# Patient Record
Sex: Female | Born: 1990 | Race: Black or African American | Hispanic: No | Marital: Married | Smoking: Never smoker
Health system: Southern US, Community
[De-identification: ages and names within clinical notes are randomized; demographics above are authoritative.]

## PROBLEM LIST (undated history)

## (undated) DIAGNOSIS — D649 Anemia, unspecified: Secondary | ICD-10-CM

## (undated) DIAGNOSIS — A159 Respiratory tuberculosis unspecified: Secondary | ICD-10-CM

## (undated) HISTORY — PX: NO PAST SURGERIES: SHX2092

## (undated) HISTORY — DX: Respiratory tuberculosis unspecified: A15.9

## (undated) HISTORY — DX: Anemia, unspecified: D64.9

---

## 2016-01-14 ENCOUNTER — Other Ambulatory Visit: Payer: Self-pay | Admitting: *Deleted

## 2016-01-14 ENCOUNTER — Ambulatory Visit
Admission: RE | Admit: 2016-01-14 | Discharge: 2016-01-14 | Disposition: A | Discharge status: Home / Self Care | Payer: 59 | Source: Ambulatory Visit | Attending: Maternal & Fetal Medicine | Admitting: Maternal & Fetal Medicine

## 2016-01-14 DIAGNOSIS — Z3493 Encounter for supervision of normal pregnancy, unspecified, third trimester: Secondary | ICD-10-CM | POA: Insufficient documentation

## 2016-01-14 DIAGNOSIS — Z3A31 31 weeks gestation of pregnancy: Secondary | ICD-10-CM | POA: Insufficient documentation

## 2016-01-14 DIAGNOSIS — O4593 Premature separation of placenta, unspecified, third trimester: Secondary | ICD-10-CM

## 2016-02-18 ENCOUNTER — Inpatient Hospital Stay
Admission: EM | Admit: 2016-02-18 | Discharge: 2016-02-22 | DRG: 765 | Disposition: A | Discharge status: Home / Self Care | Payer: 59 | Attending: Obstetrics and Gynecology | Admitting: Obstetrics and Gynecology

## 2016-02-18 DIAGNOSIS — O324XX0 Maternal care for high head at term, not applicable or unspecified: Principal | ICD-10-CM | POA: Diagnosis present

## 2016-02-18 DIAGNOSIS — O9081 Anemia of the puerperium: Secondary | ICD-10-CM | POA: Diagnosis not present

## 2016-02-18 DIAGNOSIS — O479 False labor, unspecified: Secondary | ICD-10-CM | POA: Diagnosis present

## 2016-02-18 DIAGNOSIS — D62 Acute posthemorrhagic anemia: Secondary | ICD-10-CM | POA: Diagnosis not present

## 2016-02-18 DIAGNOSIS — Z3A36 36 weeks gestation of pregnancy: Secondary | ICD-10-CM

## 2016-02-18 MED ORDER — LACTATED RINGERS IV BOLUS (SEPSIS)
1000.0000 mL | Freq: Once | INTRAVENOUS | Status: AC
  Administered 2016-02-19: 1000 mL via INTRAVENOUS

## 2016-02-18 MED ORDER — CALCIUM CARBONATE ANTACID 500 MG PO CHEW: 2.0000 | CHEWABLE_TABLET | ORAL | Status: DC | PRN

## 2016-02-18 MED ORDER — PRENATAL MULTIVITAMIN CH: 1.0000 | ORAL_TABLET | Freq: Every day | ORAL | Status: DC

## 2016-02-18 MED ORDER — LACTATED RINGERS IV SOLN
INTRAVENOUS | Status: DC
  Administered 2016-02-19 (×2): via INTRAVENOUS

## 2016-02-18 MED ORDER — DOCUSATE SODIUM 100 MG PO CAPS: 100.0000 mg | ORAL_CAPSULE | Freq: Every day | ORAL | Status: DC

## 2016-02-18 MED ORDER — TERBUTALINE SULFATE 1 MG/ML IJ SOLN
0.2500 mg | Freq: Once | INTRAMUSCULAR | Status: AC
  Administered 2016-02-18 – 2016-02-19 (×3): 0.25 mg via SUBCUTANEOUS
  Filled 2016-02-18: qty 1

## 2016-02-18 MED ORDER — ACETAMINOPHEN 325 MG PO TABS
650.0000 mg | ORAL_TABLET | ORAL | Status: DC | PRN
  Administered 2016-02-19: 650 mg via ORAL
  Filled 2016-02-18: qty 2

## 2016-02-18 NOTE — H&P (Signed)
Obstetric H&P   Chief Complaint: Contractions  Prenatal Care Provider: [facility]  History of Present Illness: 26 y.o. G1P0 755w1d by LMP=9w US derived EDD of 03/16/2016, presenting to L&D with contractions.  No LOF, no VB, +FM.  Contractions started this evening, tried po hydrating but continued to have contractions.    [facility]  23lbs weight gain this pregnancy.  Growth scan on 02/15/16 at 1878w5d 2379g or 5lbs 4oz c/w 20.5%ile, AFI 12.01cm.  Received TDAP 01/13/2016.  31 week follow up US for S<D with concern for retroplacental clot but normal DP MFM ultrasound, no risk factors or symptoms consistent with abruption.  O pos / ABSC neg / RI / VZI / RPR NR / HIV neg / HBsAg neg / 1-hr 70 / GBS unknown  Review of Systems: 10 point review of systems negative unless otherwise noted in HPI  Past Medical History: History reviewed. No pertinent past medical history.  Past Surgical History: History reviewed. No pertinent surgical history.  Family History: No family history on file.  Social History: Social History   Social History  . Marital status: Married    Spouse name: N/A  . Number of children: N/A  . Years of education: N/A   Occupational History  . Not on file.   Social History Main Topics  . Smoking status: Never Smoker  . Smokeless tobacco: Never Used  . Alcohol use No  . Drug use: No  . Sexual activity: Not Currently   Other Topics Concern  . Not on file   Social History Narrative  . No narrative on file    Medications: Prior to Admission medications   Medication Sig Start Date End Date Taking? Authorizing Provider  acetaminophen (TYLENOL) 500 MG tablet Take 500 mg by mouth every 4 (four) hours as needed.   Yes Historical Provider, MD    Allergies: No Known Allergies  Physical Exam: Vitals: Blood pressure 101/66, pulse (!) 104, temperature 97.4 F (36.3 C), temperature source Oral, resp. rate 18.  Urine Dip Protein: N/A  FHT: 130, moderate  variability, +accels, no decels Toco: q1-532min  General: NAD HEENT: normocephalic, anicteric Pulmonary: no increased work of breathing Cardiovascular: RRR Abdomen: Gravid, non-tender Leopolds: vtx Genitourinary:  Dilation: 1.5 Effacement (%): 20 Cervical Position: Posterior Exam by:: Hurshel PartyE. Rivera, RN  Extremities: no edema Neuro: grossly intact Psych: mood appropriate, affect full  Labs: No results found for this or any previous visit (from the past 24 hour(s)).  Assessment: 26 y.o. G1P0 675w1d by 03/16/2016, presenting with preterm contractions  Plan: 1)  Preterm contractions - 1 dose of terbutaline - recheck in 2-hrs  2) Fetus - cat I tacing  3) PNL - O pos / ABSC neg / RI / VZI / RPR NR / HIV neg / HBsAg neg / 1-hr 70 / GBS unknown  4) TDAP - UTD   5) Disposition - pending recheck

## 2016-02-18 NOTE — OB Triage Note (Signed)
Patient came in c/o contractions that 1200 this afternoon. Reports that since 1900 contractions are closer together and more painful. Rates pain a 6 out of 10. Denies vaginal bleeding. Reports scant amount of brown discharge.

## 2016-02-19 ENCOUNTER — Inpatient Hospital Stay: Payer: 59 | Admitting: Anesthesiology

## 2016-02-19 ENCOUNTER — Encounter: Payer: Self-pay | Admitting: Anesthesiology

## 2016-02-19 ENCOUNTER — Encounter
Admission: EM | Disposition: A | Discharge status: Home / Self Care | Payer: Self-pay | Source: Home / Self Care | Attending: Obstetrics and Gynecology

## 2016-02-19 DIAGNOSIS — O9081 Anemia of the puerperium: Secondary | ICD-10-CM | POA: Diagnosis not present

## 2016-02-19 DIAGNOSIS — O324XX0 Maternal care for high head at term, not applicable or unspecified: Secondary | ICD-10-CM | POA: Diagnosis present

## 2016-02-19 DIAGNOSIS — D62 Acute posthemorrhagic anemia: Secondary | ICD-10-CM | POA: Diagnosis not present

## 2016-02-19 DIAGNOSIS — Z3A36 36 weeks gestation of pregnancy: Secondary | ICD-10-CM | POA: Diagnosis not present

## 2016-02-19 LAB — URINALYSIS, ROUTINE W REFLEX MICROSCOPIC
Bilirubin Urine: NEGATIVE
Glucose, UA: NEGATIVE mg/dL
Ketones, ur: NEGATIVE mg/dL
Nitrite: NEGATIVE
PROTEIN: NEGATIVE mg/dL
Specific Gravity, Urine: 1.005 (ref 1.005–1.030)
pH: 8 (ref 5.0–8.0)

## 2016-02-19 LAB — TYPE AND SCREEN
ABO/RH(D): O POS
ANTIBODY SCREEN: NEGATIVE

## 2016-02-19 LAB — CBC
HEMATOCRIT: 27.9 % — AB (ref 35.0–47.0)
HEMOGLOBIN: 9 g/dL — AB (ref 12.0–16.0)
MCH: 24.8 pg — AB (ref 26.0–34.0)
MCHC: 32.3 g/dL (ref 32.0–36.0)
MCV: 76.7 fL — AB (ref 80.0–100.0)
PLATELETS: 174 10*3/uL (ref 150–440)
RBC: 3.64 MIL/uL — AB (ref 3.80–5.20)
RDW: 15.4 % — ABNORMAL HIGH (ref 11.5–14.5)
WBC: 17.5 10*3/uL — ABNORMAL HIGH (ref 3.6–11.0)

## 2016-02-19 SURGERY — Surgical Case
Anesthesia: Epidural | Site: Abdomen | Wound class: Clean Contaminated

## 2016-02-19 MED ORDER — LIDOCAINE-EPINEPHRINE (PF) 1.5 %-1:200000 IJ SOLN
INTRAMUSCULAR | Status: DC | PRN
  Administered 2016-02-19: 3 mL via PERINEURAL

## 2016-02-19 MED ORDER — PENICILLIN G POTASSIUM 5000000 UNITS IJ SOLR
5.0000 10*6.[IU] | Freq: Once | INTRAMUSCULAR | Status: AC
  Administered 2016-02-19: 5 10*6.[IU] via INTRAVENOUS
  Filled 2016-02-19: qty 5

## 2016-02-19 MED ORDER — LACTATED RINGERS IV SOLN: 500.0000 mL | INTRAVENOUS | Status: DC | PRN

## 2016-02-19 MED ORDER — TERBUTALINE SULFATE 1 MG/ML IJ SOLN
INTRAMUSCULAR | Status: AC
  Administered 2016-02-19: 0.25 mg via SUBCUTANEOUS
  Filled 2016-02-19: qty 1

## 2016-02-19 MED ORDER — OXYTOCIN 10 UNIT/ML IJ SOLN: 10.0000 [IU] | Freq: Once | INTRAMUSCULAR | Status: DC

## 2016-02-19 MED ORDER — BUTORPHANOL TARTRATE 1 MG/ML IJ SOLN
1.0000 mg | Freq: Once | INTRAMUSCULAR | Status: AC
  Administered 2016-02-19: 1 mg via INTRAVENOUS

## 2016-02-19 MED ORDER — OXYTOCIN BOLUS FROM INFUSION: 500.0000 mL | Freq: Once | INTRAVENOUS | Status: DC

## 2016-02-19 MED ORDER — BUTORPHANOL TARTRATE 1 MG/ML IJ SOLN
INTRAMUSCULAR | Status: AC
Start: 2016-02-19 — End: 2016-02-19
  Administered 2016-02-19: 1 mg via INTRAVENOUS
  Filled 2016-02-19: qty 1

## 2016-02-19 MED ORDER — BUPIVACAINE HCL (PF) 0.5 % IJ SOLN
INTRAMUSCULAR | Status: AC
  Filled 2016-02-19: qty 30

## 2016-02-19 MED ORDER — FENTANYL 2.5 MCG/ML W/ROPIVACAINE 0.2% IN NS 100 ML EPIDURAL INFUSION (ARMC-ANES)
10.0000 mL/h | EPIDURAL | Status: DC
Start: 2016-02-19 — End: 2016-02-20
  Administered 2016-02-19: 10 mL/h via EPIDURAL

## 2016-02-19 MED ORDER — PHENYLEPHRINE 40 MCG/ML (10ML) SYRINGE FOR IV PUSH (FOR BLOOD PRESSURE SUPPORT)
PREFILLED_SYRINGE | INTRAVENOUS | Status: AC
  Filled 2016-02-19: qty 10

## 2016-02-19 MED ORDER — MISOPROSTOL 200 MCG PO TABS
ORAL_TABLET | ORAL | Status: AC
  Filled 2016-02-19: qty 4

## 2016-02-19 MED ORDER — LACTATED RINGERS IV SOLN
INTRAVENOUS | Status: DC
  Administered 2016-02-19: 17:00:00 via INTRAVENOUS

## 2016-02-19 MED ORDER — SOD CITRATE-CITRIC ACID 500-334 MG/5ML PO SOLN
30.0000 mL | ORAL | Status: AC
  Administered 2016-02-19: 30 mL via ORAL

## 2016-02-19 MED ORDER — BUPIVACAINE HCL (PF) 0.5 % IJ SOLN
5.0000 mL | Freq: Once | INTRAMUSCULAR | Status: DC
  Filled 2016-02-19: qty 30

## 2016-02-19 MED ORDER — EPHEDRINE 5 MG/ML INJ: 10.0000 mg | INTRAVENOUS | Status: DC | PRN

## 2016-02-19 MED ORDER — PHENYLEPHRINE 40 MCG/ML (10ML) SYRINGE FOR IV PUSH (FOR BLOOD PRESSURE SUPPORT): 80.0000 ug | PREFILLED_SYRINGE | INTRAVENOUS | Status: DC | PRN

## 2016-02-19 MED ORDER — FENTANYL 2.5 MCG/ML W/ROPIVACAINE 0.2% IN NS 100 ML EPIDURAL INFUSION (ARMC-ANES)
EPIDURAL | Status: AC
  Filled 2016-02-19: qty 100

## 2016-02-19 MED ORDER — OXYTOCIN 10 UNIT/ML IJ SOLN
INTRAMUSCULAR | Status: AC
  Filled 2016-02-19: qty 2

## 2016-02-19 MED ORDER — OXYTOCIN 40 UNITS IN LACTATED RINGERS INFUSION - SIMPLE MED: 2.5000 [IU]/h | INTRAVENOUS | Status: DC

## 2016-02-19 MED ORDER — HYDROMORPHONE HCL 1 MG/ML IJ SOLN: 0.5000 mg | INTRAMUSCULAR | Status: DC | PRN

## 2016-02-19 MED ORDER — LACTATED RINGERS IV SOLN
500.0000 mL | Freq: Once | INTRAVENOUS | Status: AC
  Administered 2016-02-19: 500 mL via INTRAVENOUS

## 2016-02-19 MED ORDER — BUPIVACAINE 0.25 % ON-Q PUMP DUAL CATH 400 ML
400.0000 mL | INJECTION | Status: DC
Start: 2016-02-19 — End: 2016-02-20
  Filled 2016-02-19: qty 400

## 2016-02-19 MED ORDER — SOD CITRATE-CITRIC ACID 500-334 MG/5ML PO SOLN
30.0000 mL | ORAL | Status: DC | PRN
  Filled 2016-02-19: qty 15

## 2016-02-19 MED ORDER — CEFAZOLIN SODIUM-DEXTROSE 2-4 GM/100ML-% IV SOLN
2.0000 g | INTRAVENOUS | Status: AC
  Administered 2016-02-20: 2 g via INTRAVENOUS
  Filled 2016-02-19: qty 100

## 2016-02-19 MED ORDER — PENICILLIN G POTASSIUM 5000000 UNITS IJ SOLR
2.5000 10*6.[IU] | INTRAVENOUS | Status: DC
  Administered 2016-02-19 (×2): 2.5 10*6.[IU] via INTRAVENOUS
  Filled 2016-02-19 (×9): qty 2.5

## 2016-02-19 MED ORDER — ONDANSETRON HCL 4 MG/2ML IJ SOLN: 4.0000 mg | Freq: Four times a day (QID) | INTRAMUSCULAR | Status: DC | PRN

## 2016-02-19 MED ORDER — LIDOCAINE HCL (PF) 1 % IJ SOLN: 30.0000 mL | INTRAMUSCULAR | Status: DC | PRN

## 2016-02-19 MED ORDER — SODIUM CHLORIDE FLUSH 0.9 % IV SOLN
INTRAVENOUS | Status: AC
  Filled 2016-02-19: qty 10

## 2016-02-19 MED ORDER — LIDOCAINE HCL (PF) 1 % IJ SOLN
INTRAMUSCULAR | Status: DC | PRN
  Administered 2016-02-19: 3 mL

## 2016-02-19 MED ORDER — MORPHINE SULFATE (PF) 0.5 MG/ML IJ SOLN
INTRAMUSCULAR | Status: AC
  Filled 2016-02-19: qty 10

## 2016-02-19 MED ORDER — BUPIVACAINE HCL (PF) 0.5 % IJ SOLN: 5.0000 mL | Freq: Once | INTRAMUSCULAR | Status: DC

## 2016-02-19 MED ORDER — DEXTROSE 5 % IV SOLN
500.0000 mg | INTRAVENOUS | Status: AC
  Administered 2016-02-20: 500 mg via INTRAVENOUS
  Filled 2016-02-19: qty 500

## 2016-02-19 MED ORDER — DIPHENHYDRAMINE HCL 50 MG/ML IJ SOLN: 12.5000 mg | INTRAMUSCULAR | Status: DC | PRN

## 2016-02-19 MED ORDER — AMMONIA AROMATIC IN INHA
RESPIRATORY_TRACT | Status: AC
  Filled 2016-02-19: qty 10

## 2016-02-19 MED ORDER — FENTANYL CITRATE (PF) 100 MCG/2ML IJ SOLN
INTRAMUSCULAR | Status: AC
  Filled 2016-02-19: qty 2

## 2016-02-19 MED ORDER — BUPIVACAINE HCL (PF) 0.25 % IJ SOLN
INTRAMUSCULAR | Status: DC | PRN
  Administered 2016-02-19: 4 mL via EPIDURAL
  Administered 2016-02-19: 5 mL via EPIDURAL

## 2016-02-19 MED ORDER — LIDOCAINE HCL (PF) 1 % IJ SOLN
INTRAMUSCULAR | Status: AC
  Filled 2016-02-19: qty 30

## 2016-02-19 SURGICAL SUPPLY — 31 items
CANISTER SUCT 3000ML (MISCELLANEOUS) ×3 IMPLANT
CATH KIT ON-Q SILVERSOAK 5IN (CATHETERS) ×6 IMPLANT
CLOSURE WOUND 1/2 X4 (GAUZE/BANDAGES/DRESSINGS)
DRSG OPSITE POSTOP 4X10 (GAUZE/BANDAGES/DRESSINGS) IMPLANT
DRSG TELFA 3X8 NADH (GAUZE/BANDAGES/DRESSINGS) IMPLANT
ELECT CAUTERY BLADE 6.4 (BLADE) ×3 IMPLANT
ELECT REM PT RETURN 9FT ADLT (ELECTROSURGICAL) ×3
ELECTRODE REM PT RTRN 9FT ADLT (ELECTROSURGICAL) ×1 IMPLANT
GAUZE SPONGE 4X4 12PLY STRL (GAUZE/BANDAGES/DRESSINGS) IMPLANT
GLOVE BIO SURGEON STRL SZ7 (GLOVE) ×15 IMPLANT
GLOVE INDICATOR 7.5 STRL GRN (GLOVE) ×12 IMPLANT
GOWN STRL REUS W/ TWL LRG LVL3 (GOWN DISPOSABLE) ×4 IMPLANT
GOWN STRL REUS W/TWL LRG LVL3 (GOWN DISPOSABLE) ×8
LIQUID BAND (GAUZE/BANDAGES/DRESSINGS) ×3 IMPLANT
NS IRRIG 1000ML POUR BTL (IV SOLUTION) ×3 IMPLANT
PACK C SECTION AR (MISCELLANEOUS) ×3 IMPLANT
PAD OB MATERNITY 4.3X12.25 (PERSONAL CARE ITEMS) ×3 IMPLANT
PAD PREP 24X41 OB/GYN DISP (PERSONAL CARE ITEMS) ×3 IMPLANT
SPONGE LAP 18X18 5 PK (GAUZE/BANDAGES/DRESSINGS) ×3 IMPLANT
STRIP CLOSURE SKIN 1/2X4 (GAUZE/BANDAGES/DRESSINGS) IMPLANT
SUT CHROMIC GUT BROWN 0 54 (SUTURE) IMPLANT
SUT CHROMIC GUT BROWN 0 54IN (SUTURE)
SUT MNCRL 4-0 (SUTURE) ×4
SUT MNCRL 4-0 27XMFL (SUTURE) ×2
SUT PDS AB 1 TP1 96 (SUTURE) ×3 IMPLANT
SUT PLAIN 2 0 XLH (SUTURE) ×3 IMPLANT
SUT VIC AB 0 CT1 36 (SUTURE) ×9 IMPLANT
SUT VIC AB 3-0 SH 27 (SUTURE) ×2
SUT VIC AB 3-0 SH 27X BRD (SUTURE) ×1 IMPLANT
SUTURE MNCRL 4-0 27XMF (SUTURE) ×2 IMPLANT
SWABSTK COMLB BENZOIN TINCTURE (MISCELLANEOUS) IMPLANT

## 2016-02-19 NOTE — Progress Notes (Signed)
Labor Check  Subj:  Complaints: continued contractions   Obj:  BP 108/65   Pulse 82   Temp 97.7 F (36.5 C)   Resp (!) 24   Ht 5\' 8"  (1.727 m)   Wt 143 lb (64.9 kg)   BMI 21.74 kg/m     Cervix: Dilation: 4 / Effacement (%): 80 / Station: -1  (previously 2.5cm) Baseline FHR: 135    Variability: moderate    Accelerations: present    Decelerations: present (periodic variable decelerations (shallow with quick return to baseline)) Contractions: present frequency: 3 q 10 minutes  A/P: 26 y.o. G1P0 female at 4853w2d with active preterm labor.  The patient has received two doses of terbutaline and continues to contract.  Discussed preterm labor. Will not augment unless fetal testing does not resolve.  Admit now with active labor.   1.  Labor: expectant management  2.  FWB: reassuring, though somewhat guarded with variable decels, Overall assessment: category 2  3.  GBS unknown - pcn  4.  Pain: prn 5.  Recheck: q 2 hours, prn   Thomasene MohairStephen Sederick Jacobsen, MD 02/19/2016 12:55 PM

## 2016-02-19 NOTE — Progress Notes (Signed)
Patient has pushed for 2.5 hours and has made minimal descent in the latter part of that time. I had previously discussed the need for cesarean delivery, if no further descent.  On recheck after 30 minutes, there is no further descent with increasing caput.  Patient asks for 30 more minutes (3 hours total) to push.  Discussed that if fetal tracing becomes more concerning in that time, then she will need to go for cesarean delivery immediately.  If no change in that time, we will move to cesarean delivery. She voiced understanding and agreement.  Tammie MohairStephen Christalynn Boise, MD 02/19/2016 11:20 PM

## 2016-02-19 NOTE — Progress Notes (Signed)
Fetus with deep variables with each contraction. Moving to cesarean delivery.  Giving terbutaline stat to relieve contractions. If no immediate relief, will move section to STAT.  All questions answered. Patient consented by me. Reviewed all risks, benefits of surgery.    Tammie MohairStephen Annlouise Gerety, MD 02/19/2016 11:38 PM

## 2016-02-19 NOTE — Anesthesia Procedure Notes (Signed)
Epidural Patient location during procedure: OB Start time: 02/19/2016 3:18 PM End time: 02/19/2016 3:29 PM  Staffing Anesthesiologist: Berdine AddisonHOMAS, MATHAI Resident/CRNA: Ginger CarneMICHELET, Charrise Lardner Performed: resident/CRNA   Preanesthetic Checklist Completed: patient identified, site marked, surgical consent, pre-op evaluation, timeout performed, IV checked, risks and benefits discussed and monitors and equipment checked  Epidural Patient position: sitting Prep: Betadine Patient monitoring: heart rate, continuous pulse ox and blood pressure Approach: midline Location: L4-L5 Injection technique: LOR air  Needle:  Needle type: Tuohy  Needle gauge: 17 G Needle length: 9 cm and 9 Needle insertion depth: 7 cm Catheter type: closed end flexible Catheter size: 19 Gauge Catheter at skin depth: 15 cm Test dose: negative and 1.5% lidocaine with Epi 1:200 K  Assessment Sensory level: T10 Events: blood not aspirated, injection not painful, no injection resistance, negative IV test and no paresthesia  Additional Notes Pt. Evaluated and documentation done after procedure finished. Patient identified. Risks/Benefits/Options discussed with patient including but not limited to bleeding, infection, nerve damage, paralysis, failed block, incomplete pain control, headache, blood pressure changes, nausea, vomiting, reactions to medication both or allergic, itching and postpartum back pain. Confirmed with bedside nurse the patient's most recent platelet count. Confirmed with patient that they are not currently taking any anticoagulation, have any bleeding history or any family history of bleeding disorders. Patient expressed understanding and wished to proceed. All questions were answered. Sterile technique was used throughout the entire procedure. Please see nursing notes for vital signs. Test dose was given through epidural catheter and negative prior to continuing to dose epidural or start infusion. Warning signs  of high block given to the patient including shortness of breath, tingling/numbness in hands, complete motor block, or any concerning symptoms with instructions to call for help. Patient was given instructions on fall risk and not to get out of bed. All questions and concerns addressed with instructions to call with any issues or inadequate analgesia.   Patient tolerated the insertion well without immediate complications.Reason for block:procedure for pain

## 2016-02-19 NOTE — Anesthesia Preprocedure Evaluation (Signed)
Anesthesia Evaluation  Patient identified by MRN, date of birth, ID band Patient awake    Reviewed: Allergy & Precautions, NPO status , Patient's Chart, lab work & pertinent test results, reviewed documented beta blocker date and time   Airway Mallampati: II  TM Distance: >3 FB     Dental  (+) Chipped   Pulmonary           Cardiovascular      Neuro/Psych    GI/Hepatic   Endo/Other    Renal/GU      Musculoskeletal   Abdominal   Peds  Hematology   Anesthesia Other Findings   Reproductive/Obstetrics                             Anesthesia Physical Anesthesia Plan  ASA: II  Anesthesia Plan: Epidural   Post-op Pain Management:    Induction:   Airway Management Planned:   Additional Equipment:   Intra-op Plan:   Post-operative Plan:   Informed Consent: I have reviewed the patients History and Physical, chart, labs and discussed the procedure including the risks, benefits and alternatives for the proposed anesthesia with the patient or authorized representative who has indicated his/her understanding and acceptance.     Plan Discussed with: CRNA  Anesthesia Plan Comments:         Anesthesia Quick Evaluation  

## 2016-02-19 NOTE — Progress Notes (Signed)
Subjective:  Starting to feel contractions again, rates them 5/10 currently  Objective:   Vitals: Blood pressure (!) 102/54, pulse (!) 101, temperature 97.5 F (36.4 C), temperature source Oral, resp. rate 16. General: NAD Abdomen: gravid, non-tender Cervical Exam:  1/50/-3 FHT:  Toco: q5-547min, from 1-912min on admission  No results found for this or any previous visit (from the past 24 hour(s)).  Assessment:   26 y.o. G1P0 3577w2d braxton-hicks contractions  Plan:   1) Labor - braxton-hick contractions based on no cervical change.  Will continue to observe until AM to verify continue to space out and no cervical change overnight  2) Fetus - cat I tracing.

## 2016-02-19 NOTE — Progress Notes (Addendum)
Subjective:  Called to review strip  Objective:   Vitals: Blood pressure 94/62, pulse (!) 103, temperature 97.4 F (36.3 C), temperature source Oral, resp. rate 16. General:  Abdomen: Cervical Exam:  Dilation: 2.5 Effacement (%): 60 Cervical Position: Posterior Station: -3 Exam by:: Sheila OatsB. Spielman, RN   FHT: 130, moderate, no accels, intermitten variables Toco: no picking up much in way of contractions currently  No results found for this or any previous visit (from the past 24 hour(s)).  Assessment:   26 y.o. G1P0 3364w2d with threatened preterm labor  Plan:   1) Labor - contractions have spaced out but still reports 6/10 pain even after receiving stadol - will check CBC - check UA - start PCN for GBS unknown  2) Fetus - cat Ii given intermittent variables.  Not ruptured and normal AFI 4 days ago

## 2016-02-20 DIAGNOSIS — Z3A36 36 weeks gestation of pregnancy: Secondary | ICD-10-CM

## 2016-02-20 LAB — RPR: RPR: NONREACTIVE

## 2016-02-20 LAB — CBC
HCT: 24.4 % — ABNORMAL LOW (ref 35.0–47.0)
Hemoglobin: 7.8 g/dL — ABNORMAL LOW (ref 12.0–16.0)
MCH: 24.5 pg — ABNORMAL LOW (ref 26.0–34.0)
MCHC: 32 g/dL (ref 32.0–36.0)
MCV: 76.7 fL — AB (ref 80.0–100.0)
Platelets: 178 10*3/uL (ref 150–440)
RBC: 3.18 MIL/uL — AB (ref 3.80–5.20)
RDW: 15.4 % — AB (ref 11.5–14.5)
WBC: 36.1 10*3/uL — AB (ref 3.6–11.0)

## 2016-02-20 MED ORDER — NALBUPHINE HCL 10 MG/ML IJ SOLN: 5.0000 mg | INTRAMUSCULAR | Status: DC | PRN

## 2016-02-20 MED ORDER — BUPIVACAINE HCL 0.5 % IJ SOLN
INTRAMUSCULAR | Status: DC | PRN
  Administered 2016-02-20: 10 mL

## 2016-02-20 MED ORDER — NALBUPHINE HCL 10 MG/ML IJ SOLN: 5.0000 mg | Freq: Once | INTRAMUSCULAR | Status: DC | PRN

## 2016-02-20 MED ORDER — PRENATAL MULTIVITAMIN CH
1.0000 | ORAL_TABLET | Freq: Every day | ORAL | Status: DC
  Administered 2016-02-20 – 2016-02-21 (×2): 1 via ORAL
  Filled 2016-02-20 (×2): qty 1

## 2016-02-20 MED ORDER — SENNOSIDES-DOCUSATE SODIUM 8.6-50 MG PO TABS
2.0000 | ORAL_TABLET | ORAL | Status: DC
  Administered 2016-02-21 – 2016-02-22 (×2): 2 via ORAL
  Filled 2016-02-20 (×2): qty 2

## 2016-02-20 MED ORDER — EPHEDRINE SULFATE-NACL 50-0.9 MG/10ML-% IV SOSY
PREFILLED_SYRINGE | INTRAVENOUS | Status: DC | PRN
  Administered 2016-02-20: 10 mg via INTRAVENOUS

## 2016-02-20 MED ORDER — FENTANYL CITRATE (PF) 100 MCG/2ML IJ SOLN
INTRAMUSCULAR | Status: DC | PRN
  Administered 2016-02-20: 20 ug via INTRATHECAL

## 2016-02-20 MED ORDER — SIMETHICONE 80 MG PO CHEW
80.0000 mg | CHEWABLE_TABLET | Freq: Three times a day (TID) | ORAL | Status: DC
  Administered 2016-02-20 – 2016-02-22 (×7): 80 mg via ORAL
  Filled 2016-02-20 (×7): qty 1

## 2016-02-20 MED ORDER — FERROUS SULFATE 325 (65 FE) MG PO TABS
325.0000 mg | ORAL_TABLET | Freq: Two times a day (BID) | ORAL | Status: DC
  Administered 2016-02-20 – 2016-02-22 (×5): 325 mg via ORAL
  Filled 2016-02-20 (×5): qty 1

## 2016-02-20 MED ORDER — IBUPROFEN 600 MG PO TABS: 600.0000 mg | ORAL_TABLET | Freq: Four times a day (QID) | ORAL | Status: DC

## 2016-02-20 MED ORDER — NALOXONE HCL 2 MG/2ML IJ SOSY
1.0000 ug/kg/h | PREFILLED_SYRINGE | INTRAVENOUS | Status: DC | PRN
  Filled 2016-02-20: qty 2

## 2016-02-20 MED ORDER — FENTANYL CITRATE (PF) 100 MCG/2ML IJ SOLN: 25.0000 ug | INTRAMUSCULAR | Status: DC | PRN

## 2016-02-20 MED ORDER — BUPIVACAINE ON-Q PAIN PUMP (FOR ORDER SET NO CHG)
INJECTION | Status: DC
  Filled 2016-02-20: qty 1

## 2016-02-20 MED ORDER — BUPIVACAINE HCL (PF) 0.75 % IJ SOLN
INTRAMUSCULAR | Status: DC | PRN
  Administered 2016-02-20: 1.4 mL via INTRATHECAL

## 2016-02-20 MED ORDER — DIPHENHYDRAMINE HCL 25 MG PO CAPS: 25.0000 mg | ORAL_CAPSULE | Freq: Four times a day (QID) | ORAL | Status: DC | PRN

## 2016-02-20 MED ORDER — IBUPROFEN 400 MG PO TABS
400.0000 mg | ORAL_TABLET | Freq: Four times a day (QID) | ORAL | Status: DC | PRN
  Administered 2016-02-20 – 2016-02-22 (×5): 400 mg via ORAL
  Filled 2016-02-20 (×5): qty 1

## 2016-02-20 MED ORDER — OXYCODONE-ACETAMINOPHEN 5-325 MG PO TABS: 1.0000 | ORAL_TABLET | ORAL | Status: DC | PRN

## 2016-02-20 MED ORDER — SODIUM CHLORIDE 0.9% FLUSH: 3.0000 mL | INTRAVENOUS | Status: DC | PRN

## 2016-02-20 MED ORDER — KETOROLAC TROMETHAMINE 30 MG/ML IJ SOLN: 30.0000 mg | Freq: Four times a day (QID) | INTRAMUSCULAR | Status: DC | PRN

## 2016-02-20 MED ORDER — WITCH HAZEL-GLYCERIN EX PADS: 1.0000 "application " | MEDICATED_PAD | CUTANEOUS | Status: DC | PRN

## 2016-02-20 MED ORDER — NALOXONE HCL 0.4 MG/ML IJ SOLN: 0.4000 mg | INTRAMUSCULAR | Status: DC | PRN

## 2016-02-20 MED ORDER — OXYTOCIN 40 UNITS IN LACTATED RINGERS INFUSION - SIMPLE MED
INTRAVENOUS | Status: DC | PRN
  Administered 2016-02-20: 1000 mL via INTRAVENOUS

## 2016-02-20 MED ORDER — PHENYLEPHRINE 40 MCG/ML (10ML) SYRINGE FOR IV PUSH (FOR BLOOD PRESSURE SUPPORT)
PREFILLED_SYRINGE | INTRAVENOUS | Status: AC
  Filled 2016-02-20: qty 10

## 2016-02-20 MED ORDER — OXYTOCIN 40 UNITS IN LACTATED RINGERS INFUSION - SIMPLE MED: 2.5000 [IU]/h | INTRAVENOUS | Status: AC

## 2016-02-20 MED ORDER — DIBUCAINE 1 % RE OINT: 1.0000 "application " | TOPICAL_OINTMENT | RECTAL | Status: DC | PRN

## 2016-02-20 MED ORDER — ONDANSETRON HCL 4 MG/2ML IJ SOLN
INTRAMUSCULAR | Status: DC | PRN
  Administered 2016-02-20: 4 mg via INTRAVENOUS

## 2016-02-20 MED ORDER — PROPOFOL 10 MG/ML IV BOLUS
INTRAVENOUS | Status: AC
  Filled 2016-02-20: qty 20

## 2016-02-20 MED ORDER — ONDANSETRON HCL 4 MG/2ML IJ SOLN: 4.0000 mg | Freq: Three times a day (TID) | INTRAMUSCULAR | Status: DC | PRN

## 2016-02-20 MED ORDER — ONDANSETRON HCL 4 MG/2ML IJ SOLN
INTRAMUSCULAR | Status: AC
  Filled 2016-02-20: qty 2

## 2016-02-20 MED ORDER — MENTHOL 3 MG MT LOZG
1.0000 | LOZENGE | OROMUCOSAL | Status: DC | PRN
  Filled 2016-02-20: qty 9

## 2016-02-20 MED ORDER — LACTATED RINGERS IV SOLN
INTRAVENOUS | Status: DC
  Administered 2016-02-20: 12:00:00 via INTRAVENOUS

## 2016-02-20 MED ORDER — DIPHENHYDRAMINE HCL 25 MG PO CAPS
25.0000 mg | ORAL_CAPSULE | ORAL | Status: DC | PRN
  Administered 2016-02-20: 25 mg via ORAL
  Filled 2016-02-20: qty 1

## 2016-02-20 MED ORDER — MEPERIDINE HCL 25 MG/ML IJ SOLN: 6.2500 mg | INTRAMUSCULAR | Status: DC | PRN

## 2016-02-20 MED ORDER — ONDANSETRON HCL 4 MG/2ML IJ SOLN: 4.0000 mg | Freq: Once | INTRAMUSCULAR | Status: DC | PRN

## 2016-02-20 MED ORDER — PHENYLEPHRINE HCL 10 MG/ML IJ SOLN
INTRAMUSCULAR | Status: DC | PRN
  Administered 2016-02-20: 80 ug via INTRAVENOUS
  Administered 2016-02-20: 120 ug via INTRAVENOUS
  Administered 2016-02-20 (×4): 80 ug via INTRAVENOUS
  Administered 2016-02-20 (×2): 120 ug via INTRAVENOUS
  Administered 2016-02-20: 80 ug via INTRAVENOUS

## 2016-02-20 MED ORDER — EPHEDRINE SULFATE 50 MG/ML IJ SOLN
INTRAMUSCULAR | Status: DC | PRN
Start: 2016-02-20 — End: 2016-02-20
  Administered 2016-02-20: 5 mg via INTRAVENOUS

## 2016-02-20 MED ORDER — DIPHENHYDRAMINE HCL 50 MG/ML IJ SOLN: 12.5000 mg | INTRAMUSCULAR | Status: DC | PRN

## 2016-02-20 MED ORDER — ACETAMINOPHEN 500 MG PO TABS: 1000.0000 mg | ORAL_TABLET | Freq: Four times a day (QID) | ORAL | Status: DC

## 2016-02-20 MED ORDER — OXYCODONE-ACETAMINOPHEN 5-325 MG PO TABS: 2.0000 | ORAL_TABLET | ORAL | Status: DC | PRN

## 2016-02-20 MED ORDER — COCONUT OIL OIL: 1.0000 "application " | TOPICAL_OIL | Status: DC | PRN

## 2016-02-20 MED ORDER — MORPHINE SULFATE (PF) 0.5 MG/ML IJ SOLN
INTRAMUSCULAR | Status: DC | PRN
  Administered 2016-02-20: .2 mg via INTRATHECAL

## 2016-02-20 NOTE — Anesthesia Post-op Follow-up Note (Cosign Needed)
Anesthesia QCDR form completed.        

## 2016-02-20 NOTE — Anesthesia Postprocedure Evaluation (Signed)
Anesthesia Post Note  Patient: Tammie Burke  Procedure(s) Performed: Procedure(s) (LRB): CESAREAN SECTION (N/A)  Patient location during evaluation: Mother Baby Anesthesia Type: Spinal Level of consciousness: awake and alert and oriented Pain management: pain level controlled Vital Signs Assessment: post-procedure vital signs reviewed and stable Respiratory status: spontaneous breathing Cardiovascular status: stable Postop Assessment: no signs of nausea or vomiting and adequate PO intake Anesthetic complications: no     Last Vitals:  Vitals:   02/20/16 0551 02/20/16 0740  BP: 108/62 106/61  Pulse: 83 86  Resp: 20 18  Temp: 37.4 C 36.6 C    Last Pain:  Vitals:   02/20/16 0800  TempSrc:   PainSc: 0-No pain                 Rica MastBachich,  Kadarius Cuffe M

## 2016-02-20 NOTE — Transfer of Care (Signed)
Immediate Anesthesia Transfer of Care Note  Patient: Tammie Burke  Procedure(s) Performed: Procedure(s): CESAREAN SECTION (N/A)  Patient Location: PACU  Anesthesia Type:Spinal  Level of Consciousness: awake, alert  and oriented  Airway & Oxygen Therapy: Patient Spontanous Breathing  Post-op Assessment: Report given to RN and Post -op Vital signs reviewed and stable  Post vital signs: Reviewed and stable  Last Vitals:  Vitals:   02/19/16 2250 02/20/16 0145  BP:  106/60  Pulse:  87  Resp:  18  Temp: 36.7 C 36.4 C    Last Pain:  Vitals:   02/20/16 0145  TempSrc: Oral  PainSc:       Patients Stated Pain Goal: 3 (02/19/16 0447)  Complications: No apparent anesthesia complications

## 2016-02-20 NOTE — Discharge Summary (Signed)
OB Discharge Summary     Patient Name: Tammie Burke DOB: [birth year] MRN: [id number]  Date of admission: 02/18/2016 Delivering MD: Conard Novak, MD  Date of Delivery: 02/20/2016  Date of discharge: 02/22/2016  Admitting diagnosis:  1) intrauterine pregnancy at [redacted]w[redacted]d 2) preterm labor  Intrauterine pregnancy: [redacted]w[redacted]d      Secondary diagnosis: None     Discharge diagnosis: Preterm Pregnancy Delivered                                                                                                Post partum procedures:none  Augmentation: patient did not require augmentation  Complications: None  Hospital course:  Onset of Labor With Unplanned C/S  26 y.o. yo G1P0 at [redacted]w[redacted]d was admitted in Active Labor on 02/18/2016. Patient had a labor course significant for spontaneous labor without augmentation.  Patient pushed for 2.5 hours with no real descent for the past hour. Membrane Rupture Time/Date: 2:55 PM ,02/19/2016   The patient went for cesarean section due to Arrest of Descent, and delivered a Viable infant,02/20/2016  Details of operation can be found in separate operative note. Patient had a drop in hemoglobin postpartum. She was asymptomatic and did not require transfusion. She is ambulating,tolerating a regular diet, passing flatus, and urinating well. Patient and husband request discharge on Day 2 postpartum because they feel she will be able to rest better at home and she will eat better at home due to pt specific diet. Explained to patient that her hemoglobin was low and that I had planned for her to discharge after follow up CBC either tomorrow or the next day. Recommend patient be assisted to walk from car to house and to increase resting time at home while increasing PO intake of iron rich foods/drinks and h2o. Patient and husband verbalize understanding and choose to go home today. Patient is discharged home in stable condition 02/22/16.  Physical exam  Vitals:   02/21/16 2318  02/22/16 0315 02/22/16 0852 02/22/16 1127  BP: 107/61 115/73 106/68   Pulse: 80 78 83   Resp: 20 18 18    Temp: 98 F (36.7 C) 97.9 F (36.6 C) 98.2 F (36.8 C) 98 F (36.7 C)  TempSrc: Oral Oral Oral Oral  SpO2: 100% 100% 100%   Weight:      Height:       General: alert, cooperative and no distress Lochia: appropriate Uterine Fundus: firm Incision: Healing well with no significant drainage, Dressing is clean, dry, and intact DVT Evaluation: No evidence of DVT seen on physical exam. No cords or calf tenderness. No significant calf/ankle edema.  Labs: Lab Results  Component Value Date   WBC 36.1 (H) 02/20/2016   HGB 6.5 (L) 02/22/2016   HCT 24.4 (L) 02/20/2016   MCV 76.7 (L) 02/20/2016   PLT 178 02/20/2016    Discharge instruction: per After Visit Summary.  Medications:  Allergies as of 02/22/2016   No Known Allergies     Medication List    STOP taking these medications   acetaminophen 500 MG tablet Commonly known as:  TYLENOL  TAKE these medications   ferrous sulfate 325 (65 FE) MG tablet Take 1 tablet (325 mg total) by mouth daily with breakfast.   norethindrone 0.35 MG tablet Commonly known as:  MICRONOR,CAMILA,ERRIN Take 1 tablet (0.35 mg total) by mouth daily. Start taking on:  03/06/2016   oxyCODONE-acetaminophen 5-325 MG tablet Commonly known as:  PERCOCET/ROXICET Take 1 tablet by mouth every 4 (four) hours as needed for severe pain (pain score 7-10).       Diet: routine diet, with increased intake of red meat, grape juice and other iron rich foods, drink h2o to thirst  Activity: Advance as tolerated. Pelvic rest for 6 weeks.   Outpatient follow up: Follow-up Information    Conard NovakJackson, Stephen D, MD Follow up in 1 week(s).   Specialty:  Obstetrics and Gynecology Why:  post op incision check Contact information: [address] [place] [address] [phone]             Postpartum contraception: Progesterone only pills Rhogam  Given postpartum: no Rubella vaccine given postpartum: no Varicella vaccine given postpartum: no TDaP given antepartum or postpartum: antepartum Flu vaccine: not given  Newborn Data: Live born female  Birth Weight: 5 lb 1.5 oz (2310 g) APGAR: 7, 8  Baby Feeding: Breast  Disposition:home with mother  SIGNED: [name],Tammie Burke, CNM

## 2016-02-20 NOTE — Progress Notes (Signed)
Admit Date: 02/18/2016 Today's Date: 02/20/2016  Subjective: Postpartum Day 0: Cesarean Delivery Patient reports incisional pain.    Objective: Vital signs in last 24 hours: Temp:  [97.6 F (36.4 C)-99.3 F (37.4 C)] 97.8 F (36.6 C) (01/20 0740) Pulse Rate:  [75-113] 86 (01/20 0740) Resp:  [12-27] 18 (01/20 0740) BP: (93-127)/(53-78) 106/61 (01/20 0740) SpO2:  [100 %] 100 % (01/20 0740) Weight:  [64.9 kg (143 lb)] 64.9 kg (143 lb) (01/19 1152)  Physical Exam:  General: alert, cooperative and no distress Lochia: appropriate Uterine Fundus: firm Incision: healing well DVT Evaluation: No evidence of DVT seen on physical exam.   Recent Labs  02/19/16 0718 02/20/16 0556  HGB 9.0* 7.8*  HCT 27.9* 24.4*    Assessment/Plan: Status post Cesarean section. Doing well postoperatively.  Continue current care. IBF for pain. Monitor anemia  Tammie LibraRobert Paul Burke Burke 02/20/2016, 10:32 AM

## 2016-02-20 NOTE — Anesthesia Post-op Follow-up Note (Signed)
  Anesthesia Pain Follow-up Note  Patient: Tammie Burke  Day #: 1  Date of Follow-up: 02/20/2016 Time: 9:24 AM  Last Vitals:  Vitals:   02/20/16 0551 02/20/16 0740  BP: 108/62 106/61  Pulse: 83 86  Resp: 20 18  Temp: 37.4 C 36.6 C    Level of Consciousness: alert  Pain: mild   Side Effects:None  Catheter Site Exam:clean     Plan: D/C from anesthesia care at surgeon's request  Rica MastBachich,  Leiby Pigeon M

## 2016-02-20 NOTE — Op Note (Signed)
Cesarean Section Operative Note    Tammie Burke   02/20/2016   Pre-operative Diagnosis:  1) intrauterine pregnancy at [redacted]w[redacted]d  2) preterm labor 3) second stage arrest  Post-operative Diagnosis:  1) intrauterine pregnancy at [redacted]w[redacted]d  2) preterm labor 3) second stage arrest  Procedure: Primary Low Transverse Cesarean Section via Pfannenstiel incision with double layer uterine closure  Surgeon: Surgeon(s) and Role:    * Conard Novak, MD - Primary   Anesthesia: spinal   Findings:  1) normal appearing gravid uterus, fallopian tubes, and ovaries 2) viable female infant with weight 2,310 grams and APGARs of 7 and 8.   Estimated Blood Loss: 1,000 mL  Total IV Fluids: 1,500 ml crystalloid  Urine Output: 600 mL clear urine at end of case  Specimens: None  Complications: no complications  Disposition: PACU - hemodynamically stable.   Maternal Condition: stable   Baby condition / location:  Couplet care / Skin to Skin  Procedure Details:  The patient was seen in the Holding Room. The risks, benefits, complications, treatment options, and expected outcomes were discussed with the patient. The patient concurred with the proposed plan, giving informed consent. identified as Volney American and the procedure verified as C-Section Delivery. A Time Out was held and the above information confirmed.   After induction of anesthesia, the patient was draped and prepped in the usual sterile manner. A Pfannenstiel incision was made and carried down through the subcutaneous tissue to the fascia. Fascial incision was made and extended transversely. The fascia was separated from the underlying rectus tissue superiorly and inferiorly. The peritoneum was identified and entered. Peritoneal incision was extended longitudinally. The bladder flap was sharply freed from the lower uterine segment. A low transverse uterine incision was made and the hysterotomy was extended with cranial-caudal tension.  Delivered from cephalic presentation was a 2,310 gram Living newborn infant(s) or Female with Apgar scores of 7 at one minute and 8 at five minutes. Cord ph was not sent the umbilical cord was clamped and cut cord blood was obtained for evaluation. The placenta was removed Intact and appeared normal. The uterine outline, tubes and ovaries appeared normal. The uterine incision was closed with running locked sutures of 0 Vicryl.  A second layer of the same suture was thrown in an imbricating fashion.  Hemostasis was assured.  The uterus was returned to the abdomen and the paracolic gutters were cleared of all clots and debris.  The rectus muscles were inspected and found to be hemostatic.  The On-Q catheter pumps were inserted in accordance with the manufacturer's recommendations.  The catheters were inserted approximately 4cm cephelad to the incision line, approximately 1cm apart, straddling the midline.  They were inserted to a depth of the 4th mark. They were positioned superficial to the rectus abdominus muscles and deep to the rectus fascia.    The fascia was then reapproximated with running sutures of 1-0 PDS, looped. Three interrupted 3-0 Vicryl sutures were thrown along the incision line to reduce the tension on the skin closure.  The subcuticular closure was performed using 4-0 monocryl. The skin closure was reinforced using surgical skin glue.   The On-Q catheters were bolused with 5 mL of 0.5% marcaine plain for a total of 10 mL.  The catheters were affixed to the skin with surgical skin glue, steri-strips, and tegaderm.    Instrument, sponge, and needle counts were correct prior the abdominal closure and were correct at the conclusion of the case.  The patient  received Ancef 2 gram IV and Azithromycin 500 mg IV prior to skin incision (within 30 minutes). The vagina was prepped prior to the case with betadine.  For VTE prophylaxis she was wearing SCDs throughout the case.  Signed: Conard NovakStephen D.  Phylliss Strege, MD 02/20/2016 1:26 AM

## 2016-02-21 LAB — CULTURE, BETA STREP (GROUP B ONLY)

## 2016-02-21 LAB — HEMOGLOBIN: HEMOGLOBIN: 6.6 g/dL — AB (ref 12.0–16.0)

## 2016-02-21 NOTE — Progress Notes (Signed)
Admit Date: 02/18/2016 Today's Date: 02/21/2016  Subjective: Postpartum Day 0: Cesarean Delivery Patient reports minimal incisional pain.    Objective: Vital signs in last 24 hours: Temp:  [97.5 F (36.4 C)-98.7 F (37.1 C)] 97.6 F (36.4 C) (01/21 0800) Pulse Rate:  [78-83] 81 (01/21 0800) Resp:  [16-18] 18 (01/21 0800) BP: (91-105)/(54-58) 91/56 (01/21 0800) SpO2:  [99 %-100 %] 100 % (01/21 0800)  Physical Exam:  General: alert, cooperative and no distress Lochia: appropriate Uterine Fundus: firm Incision: healing well DVT Evaluation: No evidence of DVT seen on physical exam.   Recent Labs  02/19/16 0718 02/20/16 0556 02/21/16 0529  HGB 9.0* 7.8* 6.6*  HCT 27.9* 24.4*  --     Assessment/Plan: Status post Cesarean section. Doing well postoperatively.  Continue current care. IBF for pain. Monitor anemia, no other signs of symptoms related to anemia, pt prefers no blood transfusion unless worsens.  Tammie Burke 02/21/2016, 9:26 AM

## 2016-02-22 ENCOUNTER — Encounter: Payer: Self-pay | Admitting: Obstetrics and Gynecology

## 2016-02-22 LAB — HEMOGLOBIN: HEMOGLOBIN: 6.5 g/dL — AB (ref 12.0–16.0)

## 2016-02-22 MED ORDER — OXYCODONE-ACETAMINOPHEN 5-325 MG PO TABS: 1.0000 | ORAL_TABLET | ORAL | 0 refills | Status: DC | PRN

## 2016-02-22 MED ORDER — NORETHINDRONE 0.35 MG PO TABS
1.0000 | ORAL_TABLET | Freq: Every day | ORAL | 11 refills | Status: DC
Start: 2016-03-06 — End: 2016-09-20

## 2016-02-22 MED ORDER — FERROUS SULFATE 325 (65 FE) MG PO TABS: 325.0000 mg | ORAL_TABLET | Freq: Every day | ORAL | 3 refills | Status: DC

## 2016-02-22 NOTE — Progress Notes (Signed)
  Subjective:   Pt is resting in bed. She states she has been ambulating and voiding without difficulty. She has been tolerating PO intake and her pain is well controlled with PO medication and OnQ Pump. She denies feeling lightheaded or dizzy when ambulating. She admits that breastfeeding is going well.  Objective:  Blood pressure 106/68, pulse 83, temperature 98.2 F (36.8 C), temperature source Oral, resp. rate 18, height 5\' 8"  (1.727 m), weight 143 lb (64.9 kg), SpO2 100 %.  General: NAD Pulmonary: no increased work of breathing Abdomen: non-distended, non-tender, fundus firm at level of umbilicus Incision: Waffle dressing is c/d/i, no s/s of infection, OnQ pump is c/d/i Extremities: no edema, no erythema, no tenderness  Results for orders placed or performed during the hospital encounter of 02/18/16 (from the past 24 hour(s))  Hemoglobin     Status: Abnormal   Collection Time: 02/22/16  6:11 AM  Result Value Ref Range   Hemoglobin 6.5 (L) 12.0 - 16.0 g/dL     Assessment:   26 y.o. G1P0 postoperativeday # 2   Plan:  1) Acute blood loss anemia - hemodynamically stable and asymptomatic - po ferrous sulfate, CBC 02/23/2016 am  2) O positive, Rubella Immune, Varicella Immune  3) TDAP status: UTD  4) Breast//Contraception: plans Progesterone only pill  5) Disposition: Home day 3 or 4 depending on postpartum recovery   Tammie Burke, CNM

## 2016-02-22 NOTE — Discharge Instructions (Signed)
Please call your doctor or return to the ER if you experience any chest pains, shortness of breath, fever greater than 101, any heavy bleeding (saturating more than 1 pad per hour), large clots, or foul smelling discharge, any worsening abdominal pain and cramping that is not controlled by pain medication, or any signs of postpartum depression. No tampons, enemas, douches, or sexual intercourse for 6 weeks. Also avoid tub baths, hot tubs, or swimming for 6 weeks.  ° ° ° °Home Care Instructions for Mom °Introduction ° ACTIVITY °· Gradually return to your regular activities. °· Let yourself rest. Nap while your baby sleeps. °· Avoid lifting anything that is heavier than 10 lb (4.5 kg) until your health care provider says it is okay. °· Avoid activities that take a lot of effort and energy (are strenuous) until approved by your health care provider. Walking at a slow-to-moderate pace is usually safe. °· If you had a cesarean delivery: °¨ Do not vacuum, climb stairs, or drive a car for 4-6 weeks. °¨ Have someone help you at home until you feel like you can do your usual activities yourself. °¨ Do exercises as told by your health care provider, if this applies. °VAGINAL BLEEDING °You may continue to bleed for 4-6 weeks after delivery. Over time, the amount of blood usually decreases and the color of the blood usually gets lighter. However, the flow of bright red blood may increase if you have been too active. If you need to use more than one pad in an hour because your pad gets soaked, or if you pass a large clot: °· Lie down. °· Raise your feet. °· Place a cold compress on your lower abdomen. °· Rest. °· Call your health care provider. °If you are breastfeeding, your period should return anytime between 8 weeks after delivery and the time that you stop breastfeeding. If you are not breastfeeding, your period should return 6-8 weeks after delivery. °PERINEAL CARE °The perineal area, or perineum, is the part of your body  between your thighs. After delivery, this area needs special care. Follow these instructions as told by your health care provider. °· Take warm tub baths for 15-20 minutes. °· Use medicated pads and pain-relieving sprays and creams as told. °· Do not use tampons or douches until vaginal bleeding has stopped. °· Each time you go to the bathroom: °¨ Use a peri bottle. °¨ Change your pad. °¨ Use towelettes in place of toilet paper until your stitches have healed. °· Do Kegel exercises every day. Kegel exercises help to maintain the muscles that support the vagina, bladder, and bowels. You can do these exercises while you are standing, sitting, or lying down. To do Kegel exercises: °¨ Tighten the muscles of your abdomen and the muscles that surround your birth canal. °¨ Hold for a few seconds. °¨ Relax. °¨ Repeat until you have done this 5 times in a row. °· To prevent hemorrhoids from developing or getting worse: °¨ Drink enough fluid to keep your urine clear or pale yellow. °¨ Avoid straining when having a bowel movement. °¨ Take over-the-counter medicines and stool softeners as told by your health care provider. °BREAST CARE °· Wear a tight-fitting bra. °· Avoid taking over-the-counter pain medicine for breast discomfort. °· Apply ice to the breasts to help with discomfort as needed: °¨ Put ice in a plastic bag. °¨ Place a towel between your skin and the bag. °¨ Leave the ice on for 20 minutes or as told by your   health care provider. °NUTRITION °· Eat a well-balanced diet. °· Do not try to lose weight quickly by cutting back on calories. °· Take your prenatal vitamins until your postpartum checkup or until your health care provider tells you to stop. °POSTPARTUM DEPRESSION °You may find yourself crying for no apparent reason and unable to cope with all of the changes that come with having a newborn. This mood is called postpartum depression. Postpartum depression happens because your hormone levels change after  delivery. If you have postpartum depression, get support from your partner, friends, and family. If the depression does not go away on its own after several weeks, contact your health care provider. °BREAST SELF-EXAM °Do a breast self-exam each month, at the same time of the month. If you are breastfeeding, check your breasts just after a feeding, when your breasts are less full. If you are breastfeeding and your period has started, check your breasts on day 5, 6, or 7 of your period. °Report any lumps, bumps, or discharge to your health care provider. Know that breasts are normally lumpy if you are breastfeeding. This is temporary, and it is not a health risk. °INTIMACY AND SEXUALITY °Avoid sexual activity for at least 3-4 weeks after delivery or until the brownish-red vaginal flow is completely gone. If you want to avoid pregnancy, use some form of birth control. You can get pregnant after delivery, even if you have not had your period. °SEEK MEDICAL CARE IF: °· You feel unable to cope with the changes that a child brings to your life, and these feelings do not go away after several weeks. °· You notice a lump, a bump, or discharge on your breast. °SEEK IMMEDIATE MEDICAL CARE IF: °· Blood soaks your pad in 1 hour or less. °· You have: °¨ Severe pain or cramping in your lower abdomen. °¨ A bad-smelling vaginal discharge. °¨ A fever that is not controlled by medicine. °¨ A fever, and an area of your breast is red and sore. °¨ Pain or redness in your calf. °¨ Sudden, severe chest pain. °¨ Shortness of breath. °¨ Painful or bloody urination. °¨ Problems with your vision. °· You vomit for 12 hours or longer. °· You develop a severe headache. °· You have serious thoughts about hurting yourself, your child, or anyone else. °This information is not intended to replace advice given to you by your health care provider. Make sure you discuss any questions you have with your health care provider. °Document Released:  01/15/2000 Document Revised: 06/25/2015 Document Reviewed: 07/21/2014 °© 2017 Elsevier ° ° ° ° °

## 2016-02-22 NOTE — Progress Notes (Signed)
Discharge order received from doctor. Reviewed discharge instructions and prescriptions with patient and answered all questions. Follow up appointment given. Patient verbalized understanding. ID bands checked. Patient discharged home with infant via wheelchair by nursing/auxillary.   Wilhelmina Hark Garner, RN  

## 2016-04-05 ENCOUNTER — Encounter: Payer: 59 | Admitting: Obstetrics and Gynecology

## 2016-07-31 DIAGNOSIS — A159 Respiratory tuberculosis unspecified: Secondary | ICD-10-CM

## 2016-07-31 HISTORY — DX: Respiratory tuberculosis unspecified: A15.9

## 2016-08-08 ENCOUNTER — Telehealth: Payer: Self-pay

## 2016-08-08 NOTE — Telephone Encounter (Signed)
Pt's [facility] called stating pt has had a cold for a week or so.  Wanted to know safe meds.  Pt is breastfeeding.  Adv plain robitussin, plain sudafed, e.s. Tylenol, sip hot beverages/soup, gargle c warm salt water gargle, Hall's cough drops, sucrets, push fluids and rest.

## 2016-09-19 NOTE — Progress Notes (Signed)
Gynecology Annual Exam  PCP: Farrel Conners, CNM  Chief Complaint:  Chief Complaint  Patient presents with  . Gynecologic Exam    History of Present Illness: Tammie Burke is a 26 y.o. G1P0101 presents for her annual exam. The patient has no complaints today. 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 She has been taking TB meds x 1 month and is no longer contagious. Husband and daughter are not affected.   She is still breastfeeding her 48 mos old daughter about 6-7 times a day, but her menses have returned, are usually regular, they occur every month, and they last 4 days. Her flow is moderate.  Her last menstrual period was a few days late and started 09/15/2016. Last pap smear: 07/01/2015, results were NIL   The patient is sexually active. She currently uses condoms for contraception  Since her last annual exam she has had a Cesarean section  for failure to descend delivering a 5#1oz female infant. Her postpartum was complicated by anemia with a discharge hemoglobin of 6.5gm/dl. She is a vegetarian. Not taking any iron or vitamins other than vitamin b6 while on her TB medication. She is not sure of the names of the medications she takes. Has 5 more month of treatment.  The patient does not know how to perform self breast exams.  There is no family history of breast cancer.   There is no family history of ovarian cancer.   The patient denies smoking.  She denies drinking alcohol.   She denies illegal drug use.  The patient reports exercising occasionally. She does get adequate calcium in her diet.  The patient denies current symptoms of depression.    Review of Systems: Review of Systems  Constitutional: Negative for chills, fever and weight loss.  HENT: Negative for congestion, sinus pain and sore throat.   Eyes: Negative for blurred vision and pain.  Respiratory: Negative for hemoptysis, shortness of breath and wheezing.    Cardiovascular: Negative for chest pain, palpitations and leg swelling.  Gastrointestinal: Negative for abdominal pain, blood in stool, diarrhea, heartburn, nausea and vomiting.  Genitourinary: Negative for dysuria, frequency, hematuria and urgency.  Musculoskeletal: Negative for back pain, joint pain and myalgias.  Skin: Negative for itching and rash.  Neurological: Negative for dizziness, tingling and headaches.  Endo/Heme/Allergies: Negative for environmental allergies and polydipsia. Does not bruise/bleed easily.       Negative for hirsutism   Psychiatric/Behavioral: Negative for depression. The patient is not nervous/anxious and does not have insomnia.     Past Medical History:  Past Medical History:  Diagnosis Date  . Tuberculosis 07/2016    Past Surgical History:  Past Surgical History:  Procedure Laterality Date  . CESAREAN SECTION N/A 02/19/2016   Procedure: CESAREAN SECTION;  Surgeon: Conard Novak, MD;  Location: [facility] ORS;  Service: Obstetrics;  Laterality: N/A;    Family History:  Family History  Problem Relation Age of Onset  . Cancer Neg Hx   . Diabetes Neg Hx   . Hypertension Neg Hx   . Thyroid disease Neg Hx     Social History:  Social History   Social History  . Marital status: Married    Spouse name: N/A  . Number of children: 1  . Years of education: N/A   Occupational History  . Not on file.   Social History Main Topics  . Smoking status: Never Smoker  . Smokeless tobacco: Never Used  .  Alcohol use No  . Drug use: No  . Sexual activity: Yes    Birth control/ protection: Condom   Other Topics Concern  . Not on file   Social History Narrative  . No narrative on file    Allergies:  No Known Allergies  Medications: unsure of name of TB drugs except for pyroxidine. Records have been requested from the [facility].  Physical Exam Vitals: BP 102/62   Pulse 79   Ht 5\' 8"  (1.727 m)   Wt 114 lb (51.7 kg)   LMP 09/15/2016 (Exact Date)    Breastfeeding? Yes   BMI 17.33 kg/m   General: tall slender BF in NAD HEENT: normocephalic, anicteric Neck: no thyroid enlargement, no palpable nodules, shoddy cervical lymphadenopathy  Pulmonary: No increased work of breathing, CTAB Cardiovascular: RRR, without murmur  Breast: Breast asymmetrical (left>right), no tenderness, no palpable nodules or masses, no skin or nipple retraction present.  No axillary, infraclavicular or supraclavicular lymphadenopathy. Abdomen: Soft, non-tender, non-distended.  Umbilicus without lesions.  No hepatomegaly or masses palpable. No evidence of hernia. Well healed Cesarean section scar Genitourinary:  External: Normal external female genitalia.  Normal urethral meatus, normal Bartholin's and Skene's glands.    Vagina: Normal vaginal mucosa, no evidence of prolapse.    Cervix: Grossly normal in appearance, no bleeding, non-tender, anterior, parous  Uterus: Retroflexed, normal size, shape, and consistency, mobile, and non-tender  Adnexa: No adnexal masses, non-tender  Rectal: deferred  Lymphatic: no evidence of inguinal lymphadenopathy Extremities: no edema, erythema, or tenderness Neurologic: Grossly intact Psychiatric: mood appropriate, affect full     Assessment: 26 y.o. G1P0101 for annual gyn exam History of severe anemia/ vegetarian  Plan:  1) Breast cancer screening - recommend monthly self breast exam. Taught SBE  2) CBC-will call with results  3) Cervical cancer screening - Pap was done.   4) Contraception - desires to continue with  Condoms  Farrel Conners, CNM

## 2016-09-20 ENCOUNTER — Ambulatory Visit (INDEPENDENT_AMBULATORY_CARE_PROVIDER_SITE_OTHER): Payer: 59 | Admitting: Certified Nurse Midwife

## 2016-09-20 ENCOUNTER — Encounter: Payer: Self-pay | Admitting: Certified Nurse Midwife

## 2016-09-20 VITALS — BP 102/62 | HR 79 | Ht 68.0 in | Wt 114.0 lb

## 2016-09-20 DIAGNOSIS — Z01419 Encounter for gynecological examination (general) (routine) without abnormal findings: Secondary | ICD-10-CM

## 2016-09-20 DIAGNOSIS — A159 Respiratory tuberculosis unspecified: Secondary | ICD-10-CM

## 2016-09-20 DIAGNOSIS — Z862 Personal history of diseases of the blood and blood-forming organs and certain disorders involving the immune mechanism: Secondary | ICD-10-CM

## 2016-09-20 DIAGNOSIS — Z124 Encounter for screening for malignant neoplasm of cervix: Secondary | ICD-10-CM | POA: Diagnosis not present

## 2016-09-21 LAB — CBC WITH DIFFERENTIAL/PLATELET
BASOS: 0 %
Basophils Absolute: 0 10*3/uL (ref 0.0–0.2)
EOS (ABSOLUTE): 0.1 10*3/uL (ref 0.0–0.4)
EOS: 1 %
Hematocrit: 34.7 % (ref 34.0–46.6)
Hemoglobin: 10.8 g/dL — ABNORMAL LOW (ref 11.1–15.9)
IMMATURE GRANS (ABS): 0 10*3/uL (ref 0.0–0.1)
IMMATURE GRANULOCYTES: 0 %
LYMPHS ABS: 3 10*3/uL (ref 0.7–3.1)
Lymphs: 27 %
MCH: 25.3 pg — AB (ref 26.6–33.0)
MCHC: 31.1 g/dL — ABNORMAL LOW (ref 31.5–35.7)
MCV: 81 fL (ref 79–97)
MONOS ABS: 0.6 10*3/uL (ref 0.1–0.9)
Monocytes: 6 %
NEUTROS ABS: 7.3 10*3/uL — AB (ref 1.4–7.0)
Neutrophils: 66 %
PLATELETS: 350 10*3/uL (ref 150–379)
RBC: 4.27 x10E6/uL (ref 3.77–5.28)
RDW: 16.3 % — AB (ref 12.3–15.4)
WBC: 11 10*3/uL — AB (ref 3.4–10.8)

## 2016-09-22 ENCOUNTER — Telehealth: Payer: Self-pay | Admitting: Certified Nurse Midwife

## 2016-09-22 NOTE — Telephone Encounter (Signed)
Patient saw CLG 8/21, called today to let CLG know meds she is currently taking - rifampin, isoniazid, pyrazinamide and vitamin B6 25 mg.

## 2016-09-23 LAB — IGP,RFX APTIMA HPV ALL PTH: PAP SMEAR COMMENT: 0

## 2016-09-23 LAB — HPV APTIMA: HPV Aptima: NEGATIVE

## 2016-09-30 ENCOUNTER — Encounter: Payer: Self-pay | Admitting: Certified Nurse Midwife

## 2017-02-04 ENCOUNTER — Ambulatory Visit
Admission: RE | Admit: 2017-02-04 | Discharge: 2017-02-04 | Disposition: A | Discharge status: Home / Self Care | Payer: Self-pay | Source: Ambulatory Visit | Attending: Family Medicine | Admitting: Family Medicine

## 2017-02-04 ENCOUNTER — Other Ambulatory Visit (HOSPITAL_COMMUNITY): Payer: Self-pay | Admitting: Family Medicine

## 2017-02-04 DIAGNOSIS — A159 Respiratory tuberculosis unspecified: Secondary | ICD-10-CM

## 2017-02-18 ENCOUNTER — Other Ambulatory Visit (HOSPITAL_COMMUNITY): Payer: Self-pay | Admitting: Family Medicine

## 2017-02-18 ENCOUNTER — Ambulatory Visit
Admission: RE | Admit: 2017-02-18 | Discharge: 2017-02-18 | Disposition: A | Discharge status: Home / Self Care | Payer: Self-pay | Source: Ambulatory Visit | Attending: Family Medicine | Admitting: Family Medicine

## 2017-02-18 DIAGNOSIS — R918 Other nonspecific abnormal finding of lung field: Secondary | ICD-10-CM | POA: Insufficient documentation

## 2017-02-18 DIAGNOSIS — J984 Other disorders of lung: Secondary | ICD-10-CM | POA: Insufficient documentation

## 2017-02-18 DIAGNOSIS — A159 Respiratory tuberculosis unspecified: Secondary | ICD-10-CM

## 2017-05-05 ENCOUNTER — Encounter: Payer: Self-pay | Admitting: Family Medicine

## 2017-05-05 ENCOUNTER — Ambulatory Visit: Payer: Managed Care, Other (non HMO) | Admitting: Family Medicine

## 2017-05-05 VITALS — BP 118/80 | HR 77 | Temp 98.6°F | Resp 16 | Ht 67.0 in | Wt 124.0 lb

## 2017-05-05 DIAGNOSIS — H1013 Acute atopic conjunctivitis, bilateral: Secondary | ICD-10-CM | POA: Diagnosis not present

## 2017-05-05 DIAGNOSIS — A159 Respiratory tuberculosis unspecified: Secondary | ICD-10-CM | POA: Diagnosis not present

## 2017-05-05 DIAGNOSIS — Z114 Encounter for screening for human immunodeficiency virus [HIV]: Secondary | ICD-10-CM

## 2017-05-05 DIAGNOSIS — H1010 Acute atopic conjunctivitis, unspecified eye: Secondary | ICD-10-CM | POA: Insufficient documentation

## 2017-05-05 DIAGNOSIS — K219 Gastro-esophageal reflux disease without esophagitis: Secondary | ICD-10-CM | POA: Diagnosis not present

## 2017-05-05 DIAGNOSIS — D649 Anemia, unspecified: Secondary | ICD-10-CM | POA: Diagnosis not present

## 2017-05-05 MED ORDER — OLOPATADINE HCL 0.2 % OP SOLN: 1.0000 [drp] | Freq: Every day | OPHTHALMIC | 3 refills | Status: DC

## 2017-05-05 NOTE — Patient Instructions (Signed)
Try Zantac (Ranitidine) 150mg  up to twice daily as needed for reflux pain.   Gastroesophageal Reflux Disease, Adult Normally, food travels down the esophagus and stays in the stomach to be digested. If a person has gastroesophageal reflux disease (GERD), food and stomach acid move back up into the esophagus. When this happens, the esophagus becomes sore and swollen (inflamed). Over time, GERD can make small holes (ulcers) in the lining of the esophagus. Follow these instructions at home: Diet  Follow a diet as told by your doctor. You may need to avoid foods and drinks such as: ? Coffee and tea (with or without caffeine). ? Drinks that contain alcohol. ? Energy drinks and sports drinks. ? Carbonated drinks or sodas. ? Chocolate and cocoa. ? Peppermint and mint flavorings. ? Garlic and onions. ? Horseradish. ? Spicy and acidic foods, such as peppers, chili powder, curry powder, vinegar, hot sauces, and BBQ sauce. ? Citrus fruit juices and citrus fruits, such as oranges, lemons, and limes. ? Tomato-based foods, such as red sauce, chili, salsa, and pizza with red sauce. ? Fried and fatty foods, such as donuts, french fries, potato chips, and high-fat dressings. ? High-fat meats, such as hot dogs, rib eye steak, sausage, ham, and bacon. ? High-fat dairy items, such as whole milk, butter, and cream cheese.  Eat small meals often. Avoid eating large meals.  Avoid drinking large amounts of liquid with your meals.  Avoid eating meals during the 2-3 hours before bedtime.  Avoid lying down right after you eat.  Do not exercise right after you eat. General instructions  Pay attention to any changes in your symptoms.  Take over-the-counter and prescription medicines only as told by your doctor. Do not take aspirin, ibuprofen, or other NSAIDs unless your doctor says it is okay.  Do not use any tobacco products, including cigarettes, chewing tobacco, and e-cigarettes. If you need help  quitting, ask your doctor.  Wear loose clothes. Do not wear anything tight around your waist.  Raise (elevate) the head of your bed about 6 inches (15 cm).  Try to lower your stress. If you need help doing this, ask your doctor.  If you are overweight, lose an amount of weight that is healthy for you. Ask your doctor about a safe weight loss goal.  Keep all follow-up visits as told by your doctor. This is important. Contact a doctor if:  You have new symptoms.  You lose weight and you do not know why it is happening.  You have trouble swallowing, or it hurts to swallow.  You have wheezing or a cough that keeps happening.  Your symptoms do not get better with treatment.  You have a hoarse voice. Get help right away if:  You have pain in your arms, neck, jaw, teeth, or back.  You feel sweaty, dizzy, or light-headed.  You have chest pain or shortness of breath.  You throw up (vomit) and your throw up looks like blood or coffee grounds.  You pass out (faint).  Your poop (stool) is bloody or black.  You cannot swallow, drink, or eat. This information is not intended to replace advice given to you by your health care provider. Make sure you discuss any questions you have with your health care provider. Document Released: 07/06/2007 Document Revised: 06/25/2015 Document Reviewed: 05/14/2014 Elsevier Interactive Patient Education  [employer]2018 [employer]

## 2017-05-05 NOTE — Assessment & Plan Note (Signed)
Symptoms and exam c/w allergic conjunctivitis Trial of pataday drops Return precautions discussed

## 2017-05-05 NOTE — Assessment & Plan Note (Signed)
Completed 5033m course of 4 drug therapy (isoniazid, rifampin, pyrazinamide, ethambutol) on 02/17/17 Will request records from health department

## 2017-05-05 NOTE — Assessment & Plan Note (Signed)
New problem, uncontrolled Prilosec prn did seem to help with symptoms Encouraged diet changes, avoiding eating before bedtime, and raising head of bed Try H2 blocker prn Return precautions discussed

## 2017-05-05 NOTE — Assessment & Plan Note (Signed)
Seemed to have improved some on last check Will check CBC and iron panel today and treat as indicated Suspect if it was related to pregnancy and c-section, should have resolved at this time

## 2017-05-05 NOTE — Progress Notes (Signed)
Patient: Tammie Burke, Female    DOB: 1990/09/02, 27 y.o.   MRN: [id number] Visit Date: 05/05/2017  Today's Provider: Shirlee Latch, MD   I, Tammie Burke, CMA, am acting as scribe for Tammie Latch, MD.  Chief Complaint  Patient presents with  . Establish Care   Subjective:    Establish Care Tammie Burke is a 27 y.o. female who presents today to establish care. She feels fairly well. She is c/o eye redness and itching that occurs some evenings around 7:00 pm for the last week in both eyes.  Worse with lights are on. This has been occurring for about 1 week. Crusting in the mornings.  She would also like to discuss acid reflux. She takes Omeprazole 20 mg po PRN, with relief (has only used twice). She reports exercising 3 times per week. She reports she is sleeping well.  She has stopped eating oranges because she noticed they triggered her.  TB: Diagnosed in 07/2016.  Completed treatment with health dept in 01/2017.  Was told that f/u could be completed by PCP.  Anemia: was told that it was related to C-section delivery of baby last year.  She has not received any treatment -----------------------------------------------------------------   Review of Systems  Constitutional: Negative.   HENT: Negative.   Eyes: Positive for redness and itching. Negative for photophobia, pain, discharge and visual disturbance.  Respiratory: Negative.   Cardiovascular: Negative.   Gastrointestinal: Negative.   Endocrine: Negative.   Genitourinary: Negative.   Musculoskeletal: Negative.   Skin: Negative.   Allergic/Immunologic: Negative.   Neurological: Negative.   Hematological: Negative.   Psychiatric/Behavioral: Negative.     Social History      She  reports that she has never smoked. She has never used smokeless tobacco. She reports that she does not drink alcohol or use drugs.       Social History   Socioeconomic History  . Marital status:  Married    Spouse name: Tammie Burke  . Number of children: 1  . Years of education: Not on file  . Highest education level: Some college, no degree  Occupational History  . Occupation: CNA  Social Needs  . Financial resource strain: Not on file  . Food insecurity:    Worry: Not on file    Inability: Not on file  . Transportation needs:    Medical: Not on file    Non-medical: Not on file  Tobacco Use  . Smoking status: Never Smoker  . Smokeless tobacco: Never Used  Substance and Sexual Activity  . Alcohol use: No  . Drug use: No  . Sexual activity: Yes    Birth control/protection: Condom  Lifestyle  . Physical activity:    Days per week: Not on file    Minutes per session: Not on file  . Stress: Not on file  Relationships  . Social connections:    Talks on phone: Not on file    Gets together: Not on file    Attends religious service: Not on file    Active member of club or organization: Not on file    Attends meetings of clubs or organizations: Not on file    Relationship status: Not on file  Other Topics Concern  . Not on file  Social History Narrative  . Not on file    Past Medical History:  Diagnosis Date  . Anemia   . Tuberculosis 07/2016     Patient Active Problem List  Diagnosis Date Noted  . GERD (gastroesophageal reflux disease) 05/05/2017  . Anemia 05/05/2017  . Allergic conjunctivitis 05/05/2017  . Tuberculosis 09/20/2016    Past Surgical History:  Procedure Laterality Date  . CESAREAN SECTION N/A 02/19/2016   Procedure: CESAREAN SECTION;  Surgeon: Tammie NovakStephen D Jackson, MD;  Location: [facility] ORS;  Service: Obstetrics;  Laterality: N/A;    Family History        Family Status  Relation Name Status  . Mother  Alive  . Father  Deceased       accidental death  . Sister x 5 Alive  . Brother x 3 Alive  . Neg Hx  (Not Specified)        Her family history includes Healthy in her brother, mother, and sister. There is no history of Cancer, Diabetes,  Hypertension, or Thyroid disease.      No Known Allergies   Current Outpatient Medications:  .  omeprazole (PRILOSEC) 20 MG capsule, Take 20 mg by mouth daily as needed., Disp: , Rfl:  .  Olopatadine HCl 0.2 % SOLN, Apply 1 drop to eye daily., Disp: 2.5 mL, Rfl: 3   Patient Care Team: Tammie DownerBacigalupo, Tammie M, MD as PCP - General (Family Medicine)      Objective:   Vitals: BP 118/80 (BP Location: Left Arm, Patient Position: Sitting, Cuff Size: Normal)   Pulse 77   Temp 98.6 F (37 C) (Oral)   Resp 16   Ht 5\' 7"  (1.702 m)   Wt 124 lb (56.2 kg)   LMP 05/02/2017   SpO2 99%   BMI 19.42 kg/m    Vitals:   05/05/17 1017  BP: 118/80  Pulse: 77  Resp: 16  Temp: 98.6 F (37 C)  TempSrc: Oral  SpO2: 99%  Weight: 124 lb (56.2 kg)  Height: 5\' 7"  (1.702 m)     Physical Exam  Constitutional: She is oriented to person, place, and time. She appears well-developed and well-nourished. No distress.  HENT:  Head: Normocephalic and atraumatic.  Right Ear: External ear normal.  Left Ear: External ear normal.  Nose: Nose normal.  Mouth/Throat: Oropharynx is clear and moist.  Eyes: Pupils are equal, round, and reactive to light. EOM are normal. Right conjunctiva is injected. Left conjunctiva is injected. No scleral icterus.  Neck: Neck supple. No thyromegaly present.  Cardiovascular: Normal rate, regular rhythm, normal heart sounds and intact distal pulses.  No murmur heard. Pulmonary/Chest: Breath sounds normal. No respiratory distress. She has no wheezes. She has no rales.  Abdominal: Soft. Bowel sounds are normal. She exhibits no distension. There is no tenderness. There is no rebound and no guarding.  Musculoskeletal: She exhibits no edema or deformity.  Lymphadenopathy:    She has no cervical adenopathy.  Neurological: She is alert and oriented to person, place, and time.  Skin: Skin is warm and dry. No rash noted.  Psychiatric: She has a normal mood and affect. Her behavior is  normal.  Vitals reviewed.   Assessment & Plan:    Problem List Items Addressed This Visit      Digestive   GERD (gastroesophageal reflux disease) - Primary    New problem, uncontrolled Prilosec prn did seem to help with symptoms Encouraged diet changes, avoiding eating before bedtime, and raising head of bed Try H2 blocker prn Return precautions discussed      Relevant Medications   omeprazole (PRILOSEC) 20 MG capsule   Other Relevant Orders   Comprehensive metabolic panel  Other   Tuberculosis    Completed 78m course of 4 drug therapy (isoniazid, rifampin, pyrazinamide, ethambutol) on 02/17/17 Will request records from health department      Relevant Orders   Comprehensive metabolic panel   Anemia    Seemed to have improved some on last check Will check CBC and iron panel today and treat as indicated Suspect if it was related to pregnancy and c-section, should have resolved at this time      Relevant Orders   CBC w/Diff/Platelet   Fe+TIBC+Fer   Comprehensive metabolic panel   Allergic conjunctivitis    Symptoms and exam c/w allergic conjunctivitis Trial of pataday drops Return precautions discussed       Other Visit Diagnoses    Screening for HIV (human immunodeficiency virus)       Relevant Orders   HIV antibody (with reflex)      Return in about 1 year (around 05/06/2018) for physical.   The entirety of the information documented in the History of Present Illness, Review of Systems and Physical Exam were personally obtained by me. Portions of this information were initially documented by Irving Burton Ratchford, CMA and reviewed by me for thoroughness and accuracy.    Tammie Downer, MD, MPH [facility] 05/05/2017 10:57 AM

## 2017-05-06 LAB — CBC WITH DIFFERENTIAL/PLATELET
BASOS: 1 %
Basophils Absolute: 0.1 10*3/uL (ref 0.0–0.2)
EOS (ABSOLUTE): 0.1 10*3/uL (ref 0.0–0.4)
EOS: 1 %
HEMATOCRIT: 33.8 % — AB (ref 34.0–46.6)
Hemoglobin: 11.1 g/dL (ref 11.1–15.9)
IMMATURE GRANS (ABS): 0 10*3/uL (ref 0.0–0.1)
IMMATURE GRANULOCYTES: 0 %
LYMPHS: 34 %
Lymphocytes Absolute: 2.9 10*3/uL (ref 0.7–3.1)
MCH: 27.1 pg (ref 26.6–33.0)
MCHC: 32.8 g/dL (ref 31.5–35.7)
MCV: 82 fL (ref 79–97)
MONOS ABS: 0.5 10*3/uL (ref 0.1–0.9)
Monocytes: 6 %
NEUTROS PCT: 58 %
Neutrophils Absolute: 5 10*3/uL (ref 1.4–7.0)
PLATELETS: 231 10*3/uL (ref 150–379)
RBC: 4.1 x10E6/uL (ref 3.77–5.28)
RDW: 16.3 % — AB (ref 12.3–15.4)
WBC: 8.6 10*3/uL (ref 3.4–10.8)

## 2017-05-06 LAB — IRON,TIBC AND FERRITIN PANEL
Ferritin: 9 ng/mL — ABNORMAL LOW (ref 15–150)
IRON SATURATION: 5 % — AB (ref 15–55)
IRON: 16 ug/dL — AB (ref 27–159)
Total Iron Binding Capacity: 334 ug/dL (ref 250–450)
UIBC: 318 ug/dL (ref 131–425)

## 2017-05-06 LAB — COMPREHENSIVE METABOLIC PANEL
ALBUMIN: 4.3 g/dL (ref 3.5–5.5)
ALK PHOS: 72 IU/L (ref 39–117)
ALT: 9 IU/L (ref 0–32)
AST: 13 IU/L (ref 0–40)
Albumin/Globulin Ratio: 1.4 (ref 1.2–2.2)
BILIRUBIN TOTAL: 0.3 mg/dL (ref 0.0–1.2)
BUN / CREAT RATIO: 20 (ref 9–23)
BUN: 13 mg/dL (ref 6–20)
CHLORIDE: 102 mmol/L (ref 96–106)
CO2: 21 mmol/L (ref 20–29)
Calcium: 9.4 mg/dL (ref 8.7–10.2)
Creatinine, Ser: 0.64 mg/dL (ref 0.57–1.00)
GFR calc Af Amer: 140 mL/min/{1.73_m2} (ref >59)
GFR calc non Af Amer: 122 mL/min/{1.73_m2} (ref >59)
GLOBULIN, TOTAL: 3.1 g/dL (ref 1.5–4.5)
GLUCOSE: 80 mg/dL (ref 65–99)
Potassium: 3.5 mmol/L (ref 3.5–5.2)
SODIUM: 137 mmol/L (ref 134–144)
Total Protein: 7.4 g/dL (ref 6.0–8.5)

## 2017-05-06 LAB — HIV ANTIBODY (ROUTINE TESTING W REFLEX): HIV Screen 4th Generation wRfx: NONREACTIVE

## 2017-05-08 ENCOUNTER — Telehealth: Payer: Self-pay

## 2017-05-08 MED ORDER — FERROUS SULFATE 325 (65 FE) MG PO TABS: 325.0000 mg | ORAL_TABLET | Freq: Every day | ORAL | 3 refills | Status: DC

## 2017-05-08 NOTE — Telephone Encounter (Signed)
Pt advised and agrees with treatment plan. Ferrous sulfate added to med list.

## 2017-05-08 NOTE — Telephone Encounter (Signed)
-----   Message from Erasmo DownerAngela M Bacigalupo, MD sent at 05/08/2017  8:19 AM EDT ----- Normal blood counts, kidney function, liver function, electrolytes, blood sugar.  Though there is no anemia, patient is significantly iron deficient.  Recommend daily iron supplement.  This is ferrous sulfate 325 mg daily.  This can cause constipation or darkening of the stools.  She can take with Colace or stool softener to help with that.  If left untreated, this can cause anemia.  Negative HIV screening  Bacigalupo, Marzella SchleinAngela M, MD, MPH [facility] 05/08/2017 8:19 AM

## 2017-06-07 ENCOUNTER — Encounter: Payer: Self-pay | Admitting: Family Medicine

## 2017-06-07 NOTE — Progress Notes (Signed)
Left message advising pt to contact health dept. OK per DPR.

## 2017-06-07 NOTE — Progress Notes (Signed)
Patient's medical records received from the [facility] department.  Patient was initially seen on 08/16/2016 for TB testing and suspected active TB.  It appears [facility] [place] on 08/14/2016 for sore throat and cough as well as fevers of 101.4 several nights.  Per her chest x-ray at urgent care she was diagnosed with pneumonia but cannot rule out TB.  No PPD or quant gold was drawn at that visit.  She was treated with Augmentin and sent to the health department.  She was born in [place] but arrived in [place] on 05/29/2015.  She had no known exposure to tuberculosis.  There are no records for her testing that confirms tuberculosis, but she was started on directly observed therapy with INH, rifampin, PZA, EMB, pyridoxine on 08/18/2016.  Sputum cultures from 09/28/2016 and 09/29/2016 were smear negative.  Unclear if patient finished entire course of therapy and testing was completed at end of treatment.  She should call health dept and ensure that she completed her entire treatment.  Erasmo Downer, MD, MPH [facility] 06/07/2017 11:57 AM

## 2017-06-13 ENCOUNTER — Telehealth: Payer: Self-pay

## 2017-06-13 NOTE — Telephone Encounter (Signed)
Marcelino Duster, a nurse from the health department is calling to let Dr. B know that the patient did complete her therapy for TB back in January. I have requested the records for review. Just FYI.

## 2017-06-14 NOTE — Telephone Encounter (Signed)
Noted.  Erasmo Downer, MD, MPH [facility] 06/14/2017 8:46 AM

## 2017-11-06 ENCOUNTER — Encounter: Payer: Self-pay | Admitting: Family Medicine

## 2017-11-06 ENCOUNTER — Ambulatory Visit: Payer: Managed Care, Other (non HMO) | Admitting: Family Medicine

## 2017-11-06 VITALS — BP 120/78 | HR 93 | Temp 98.2°F | Wt 123.4 lb

## 2017-11-06 DIAGNOSIS — G8929 Other chronic pain: Secondary | ICD-10-CM | POA: Diagnosis not present

## 2017-11-06 DIAGNOSIS — K219 Gastro-esophageal reflux disease without esophagitis: Secondary | ICD-10-CM

## 2017-11-06 DIAGNOSIS — M546 Pain in thoracic spine: Secondary | ICD-10-CM | POA: Diagnosis not present

## 2017-11-06 DIAGNOSIS — M62838 Other muscle spasm: Secondary | ICD-10-CM | POA: Diagnosis not present

## 2017-11-06 MED ORDER — CYCLOBENZAPRINE HCL 5 MG PO TABS: 5.0000 mg | ORAL_TABLET | Freq: Every evening | ORAL | 1 refills | Status: DC | PRN

## 2017-11-06 MED ORDER — OMEPRAZOLE 20 MG PO CPDR: 20.0000 mg | DELAYED_RELEASE_CAPSULE | Freq: Two times a day (BID) | ORAL | 5 refills | Status: DC

## 2017-11-06 MED ORDER — MELOXICAM 15 MG PO TABS: 15.0000 mg | ORAL_TABLET | Freq: Every day | ORAL | 0 refills | Status: DC

## 2017-11-06 NOTE — Patient Instructions (Signed)

## 2017-11-06 NOTE — Progress Notes (Signed)
Patient: Tammie Burke Female    DOB: September 02, 1990   27 y.o.   MRN: [id number] Visit Date: 11/06/2017  Today's Provider: Shirlee Latch, MD   Chief Complaint  Patient presents with  . Back Pain  . Shoulder Pain   Subjective:    Back Pain  The current episode started more than 1 month ago. The problem occurs constantly. The problem is unchanged. The quality of the pain is described as aching and burning. The pain is at a severity of 7/10. The pain is moderate. The pain is worse during the day. Pertinent negatives include no abdominal pain, headaches, numbness, tingling or weakness.  Shoulder Pain   The pain is present in the back, right shoulder and left shoulder. This is a recurrent problem. The current episode started more than 1 month ago. The problem occurs constantly. The problem has been unchanged. The quality of the pain is described as aching and burning. The pain is at a severity of 7/10. The pain is moderate. Pertinent negatives include no numbness or tingling.   Pain is in posterior L shoulder to L side of T spine.  It has been present x3 months and worsening over that time.  She tried Icy hot with no relief.  No known injury/trauma, but she was breastfeeding her 67 month old up until about 2 weeks ago.  She was only breastfeeding on her left side due to letdown issues on the R.  She also carries her toddler frequently.  Pain is worse first thing in the morning.  She has also noticed that epigastric burning pain from GERD is worsening.  This is worse first thing in the morning when she wakes up.  She is not eating close to bed time.  She is taking Omeprazole daily without relief.  She has never seen GI or had an EGD.    No Known Allergies   Current Outpatient Medications:  .  ferrous sulfate 325 (65 FE) MG tablet, Take 1 tablet (325 mg total) by mouth daily with breakfast., Disp: , Rfl: 3 .  Olopatadine HCl 0.2 % SOLN, Apply 1 drop to eye daily., Disp: 2.5 mL,  Rfl: 3 .  omeprazole (PRILOSEC) 20 MG capsule, Take 20 mg by mouth daily as needed., Disp: , Rfl:   Review of Systems  Constitutional: Negative.   Respiratory: Negative.   Cardiovascular: Negative.   Gastrointestinal: Negative for abdominal pain.  Musculoskeletal: Positive for back pain. Arthralgias: shoulder pain   Neurological: Negative for tingling, weakness, numbness and headaches.    Social History   Tobacco Use  . Smoking status: Never Smoker  . Smokeless tobacco: Never Used  Substance Use Topics  . Alcohol use: No   Objective:   There were no vitals taken for this visit. There were no vitals filed for this visit.   Physical Exam  Constitutional: She is oriented to person, place, and time. She appears well-developed and well-nourished. No distress.  HENT:  Head: Normocephalic and atraumatic.  Eyes: Conjunctivae are normal. No scleral icterus.  Cardiovascular: Normal rate, regular rhythm, normal heart sounds and intact distal pulses.  No murmur heard. Pulmonary/Chest: Effort normal and breath sounds normal. No respiratory distress. She has no wheezes. She has no rales.  Abdominal: Soft. She exhibits no distension. There is no tenderness. There is no guarding.  Musculoskeletal:       Left shoulder: She exhibits normal range of motion, no tenderness, no bony tenderness, no swelling, no effusion, no deformity, normal  pulse and normal strength.       Thoracic back: She exhibits tenderness and spasm (L sided Trapezius). She exhibits normal range of motion, no bony tenderness, no swelling, no edema and no deformity.  Neurological: She is alert and oriented to person, place, and time.  Skin: Skin is warm and dry. Capillary refill takes less than 2 seconds. No rash noted.  Psychiatric: She has a normal mood and affect. Her behavior is normal.  Vitals reviewed.       Assessment & Plan:   1. Trapezius muscle spasm 2. Chronic left-sided thoracic back pain - new problem -  muscle spasm on exam without loss of shoulder ROM or any signs of bony pathology - discussed HEP, icing - suspect that mal-positioning and uneven distribution of weight from feeding and carrying toddler has contributed - mobic and flexeril prn - return precautions discussed  3. Gastroesophageal reflux disease, esophagitis presence not specified - chronic, uncontrolled - discussed not eating before bed, elevating head of bed, tums prn, foods to avoid, and other lifestyle interventions - will increase omeprazole to BID - if not improved on this, likely needs GI referral for possible EGD - could consider H pylori testing, but may be inaccurate in setting of chronic PPI use    Meds ordered this encounter  Medications  . omeprazole (PRILOSEC) 20 MG capsule    Sig: Take 1 capsule (20 mg total) by mouth 2 (two) times daily before a meal.    Dispense:  60 capsule    Refill:  5  . meloxicam (MOBIC) 15 MG tablet    Sig: Take 1 tablet (15 mg total) by mouth daily.    Dispense:  30 tablet    Refill:  0  . cyclobenzaprine (FLEXERIL) 5 MG tablet    Sig: Take 1 tablet (5 mg total) by mouth at bedtime as needed for muscle spasms.    Dispense:  30 tablet    Refill:  1     Return if symptoms worsen or fail to improve.   The entirety of the information documented in the History of Present Illness, Review of Systems and Physical Exam were personally obtained by me. Portions of this information were initially documented by Presley Raddle, CMA and reviewed by me for thoroughness and accuracy.    Erasmo Downer, MD, MPH [facility] 11/06/2017 10:48 AM

## 2018-01-31 NOTE — L&D Delivery Note (Signed)
Delivery Note  [phone] In room to see patient, fetal vertex visible with maternal pushing efforts. Philip Aspen, CNM relieved from duties at bedside.   Effective coached maternal pushing efforts in various positions.  Spontaneous vaginal birth of liveborn female infant at 69 in direct occiput posterior position. Infant immediately to maternal abdomen. Delayed cord clamping, skin to skin, three (3) vessel cord. Cord blood collected. APGARs: 8, 9. Weight: pending. Receiving nurse present at bedside.   IM pitocin given in left thigh, see chart. Spontaneous delivery of intact placenta at 0730, routine disposal. Large gush of blood with clots noted. Uterus firm with massage. First degree perineal laceration repaired with 3-0 vicryl rapide and local anesthesia. Laceration hemostatic and well approximated. Uterus firm. Rubra: small. EBL: 600 ml. Counts correct x 2. Vault check completed.    Initiate routine postpartum care and orders. Mom to postpartum.  Baby to Couplet care / Skin to Skin.  FOB present at bedside and overjoyed with birth.   Dr. Amalia Hailey and OR contacted regarding successful vaginal birth after cesarean section.    Diona Fanti, CNM [facility] 10/28/2018, 8:10 AM

## 2018-04-05 ENCOUNTER — Ambulatory Visit: Payer: Managed Care, Other (non HMO) | Admitting: Certified Nurse Midwife

## 2018-04-05 ENCOUNTER — Encounter: Payer: Self-pay | Admitting: Certified Nurse Midwife

## 2018-04-05 VITALS — BP 117/70 | HR 84 | Ht 67.0 in | Wt 122.0 lb

## 2018-04-05 DIAGNOSIS — N926 Irregular menstruation, unspecified: Secondary | ICD-10-CM | POA: Diagnosis not present

## 2018-04-05 DIAGNOSIS — Z98891 History of uterine scar from previous surgery: Secondary | ICD-10-CM

## 2018-04-05 DIAGNOSIS — Z3201 Encounter for pregnancy test, result positive: Secondary | ICD-10-CM

## 2018-04-05 HISTORY — DX: History of uterine scar from previous surgery: Z98.891

## 2018-04-05 LAB — POCT URINE PREGNANCY: PREG TEST UR: POSITIVE — AB

## 2018-04-05 NOTE — Patient Instructions (Addendum)
WE WOULD LOVE TO HEAR FROM YOU!!!!   Thank you Tammie Burke for visiting [facility].  Providing our patients with the best experience possible is really important to [place], and we hope that you felt that on your recent visit. The most valuable feedback we get comes from [place]!!    If you receive a survey please take a couple of minutes to let us know how we did.Thank you for continuing to trust [place] with your care.   [facility]   WHAT OB PATIENTS CAN EXPECT   Confirmation of pregnancy and ultrasound ordered if medically indicated-[redacted] weeks gestation  New OB (NOB) intake with nurse and New OB (NOB) labs- [redacted] weeks gestation  New OB (NOB) physical examination with provider- 11/[redacted] weeks gestation  Flu vaccine-[redacted] weeks gestation  Anatomy scan-[redacted] weeks gestation  Glucose tolerance test, blood work to test for anemia, T-dap vaccine-[redacted] weeks gestation  Vaginal swabs/cultures-STD/Group B strep-[redacted] weeks gestation  Appointments every 4 weeks until 28 weeks  Every 2 weeks from 28 weeks until 36 weeks  Weekly visits from 36 weeks until delivery  Common Medications Safe in Pregnancy  Acne:      Constipation:  Benzoyl Peroxide     Colace  Clindamycin      Dulcolax Suppository  Topica Erythromycin     Fibercon  Salicylic Acid      Metamucil         Miralax AVOID:        Senakot   Accutane    Cough:  Retin-A       Cough Drops  Tetracycline      Phenergan w/ Codeine if Rx  Minocycline      Robitussin (Plain & DM)  Antibiotics:     Crabs/Lice:  Ceclor       RID  Cephalosporins    AVOID:  E-Mycins      Kwell  Keflex  Macrobid/Macrodantin   Diarrhea:  Penicillin      Kao-Pectate  Zithromax      Imodium AD         PUSH FLUIDS AVOID:       Cipro     Fever:  Tetracycline      Tylenol (Regular or Extra  Minocycline       Strength)  Levaquin      Extra Strength-Do not          Exceed 8 tabs/24 hrs Caffeine:        '200mg'$ /day (equiv. To  1 cup of coffee or  approx. 3 12 oz sodas)         Gas: Cold/Hayfever:       Gas-X  Benadryl      Mylicon  Claritin       Phazyme  **Claritin-D        Chlor-Trimeton    Headaches:  Dimetapp      ASA-Free Excedrin  Drixoral-Non-Drowsy     Cold Compress  Mucinex (Guaifenasin)     Tylenol (Regular or Extra  Sudafed/Sudafed-12 Hour     Strength)  **Sudafed PE Pseudoephedrine   Tylenol Cold & Sinus     Vicks Vapor Rub  Zyrtec  **AVOID if Problems With Blood Pressure         Heartburn: Avoid lying down for at least 1 hour after meals  Aciphex      Maalox     Rash:  Milk of Magnesia     Benadryl    Mylanta  1% Hydrocortisone Cream  Pepcid  Pepcid Complete   Sleep Aids:  Prevacid      Ambien   Prilosec       Benadryl  Rolaids       Chamomile Tea  Tums (Limit 4/day)     Unisom  Zantac       Tylenol PM         Warm milk-add vanilla or  Hemorrhoids:       Sugar for taste  Anusol/Anusol H.C.  (RX: Analapram 2.5%)  Sugar Substitutes:  Hydrocortisone OTC     Ok in moderation  Preparation H      Tucks        Vaseline lotion applied to tissue with wiping    Herpes:     Throat:  Acyclovir      Oragel  Famvir  Valtrex     Vaccines:         Flu Shot Leg Cramps:       *Gardasil  Benadryl      Hepatitis A         Hepatitis B Nasal Spray:       Pneumovax  Saline Nasal Spray     Polio Booster         Tetanus Nausea:       Tuberculosis test or PPD  Vitamin B6 25 mg TID   AVOID:    Dramamine      *Gardasil  Emetrol       Live Poliovirus  Ginger Root 250 mg QID    MMR (measles, mumps &  High Complex Carbs @ Bedtime    rebella)  Sea Bands-Accupressure    Varicella (Chickenpox)  Unisom 1/2 tab TID     *No known complications           If received before Pain:         Known pregnancy;   Darvocet       Resume series after  Lortab        Delivery  Percocet    Yeast:   Tramadol      Femstat  Tylenol  3      Gyne-lotrimin  Ultram       Monistat  Vicodin           MISC:         All Sunscreens           Hair Coloring/highlights          Insect Repellant's          (Including DEET)         Mystic Tans First Trimester of Pregnancy  The first trimester of pregnancy is from week 1 until the end of week 13 (months 1 through 3). During this time, your baby will begin to develop inside you. At 6-8 weeks, the eyes and face are formed, and the heartbeat can be seen on ultrasound. At the end of 12 weeks, all the baby's organs are formed. Prenatal care is all the medical care you receive before the birth of your baby. Make sure you get good prenatal care and follow all of your doctor's instructions. Follow these instructions at home: Medicines  Take over-the-counter and prescription medicines only as told by your doctor. Some medicines are safe and some medicines are not safe during pregnancy.  Take a prenatal vitamin that contains at least 600 micrograms (mcg) of folic acid.  If you have trouble pooping (constipation), take medicine that will make your stool soft (  stool softener) if your doctor approves. Eating and drinking   Eat regular, healthy meals.  Your doctor will tell you the amount of weight gain that is right for you.  Avoid raw meat and uncooked cheese.  If you feel sick to your stomach (nauseous) or throw up (vomit): ? Eat 4 or 5 small meals a day instead of 3 large meals. ? Try eating a few soda crackers. ? Drink liquids between meals instead of during meals.  To prevent constipation: ? Eat foods that are high in fiber, like fresh fruits and vegetables, whole grains, and beans. ? Drink enough fluids to keep your pee (urine) clear or pale yellow. Activity  Exercise only as told by your doctor. Stop exercising if you have cramps or pain in your lower belly (abdomen) or low back.  Do not exercise if it is too hot, too humid, or if you are in a place of great height (high  altitude).  Try to avoid standing for long periods of time. Move your legs often if you must stand in one place for a long time.  Avoid heavy lifting.  Wear low-heeled shoes. Sit and stand up straight.  You can have sex unless your doctor tells you not to. Relieving pain and discomfort  Wear a good support bra if your breasts are sore.  Take warm water baths (sitz baths) to soothe pain or discomfort caused by hemorrhoids. Use hemorrhoid cream if your doctor says it is okay.  Rest with your legs raised if you have leg cramps or low back pain.  If you have puffy, bulging veins (varicose veins) in your legs: ? Wear support hose or compression stockings as told by your doctor. ? Raise (elevate) your feet for 15 minutes, 3-4 times a day. ? Limit salt in your food. Prenatal care  Schedule your prenatal visits by the twelfth week of pregnancy.  Write down your questions. Take them to your prenatal visits.  Keep all your prenatal visits as told by your doctor. This is important. Safety  Wear your seat belt at all times when driving.  Make a list of emergency phone numbers. The list should include numbers for family, friends, the hospital, and police and fire departments. General instructions  Ask your doctor for a referral to a local prenatal class. Begin classes no later than at the start of month 6 of your pregnancy.  Ask for help if you need counseling or if you need help with nutrition. Your doctor can give you advice or tell you where to go for help.  Do not use hot tubs, steam rooms, or saunas.  Do not douche or use tampons or scented sanitary pads.  Do not cross your legs for long periods of time.  Avoid all herbs and alcohol. Avoid drugs that are not approved by your doctor.  Do not use any tobacco products, including cigarettes, chewing tobacco, and electronic cigarettes. If you need help quitting, ask your doctor. You may get counseling or other support to help you  quit.  Avoid cat litter boxes and soil used by cats. These carry germs that can cause birth defects in the baby and can cause a loss of your baby (miscarriage) or stillbirth.  Visit your dentist. At home, brush your teeth with a soft toothbrush. Be gentle when you floss. Contact a doctor if:  You are dizzy.  You have mild cramps or pressure in your lower belly.  You have a nagging pain in your belly area.  You continue to feel sick to your stomach, you throw up, or you have watery poop (diarrhea).  You have a bad smelling fluid coming from your vagina.  You have pain when you pee (urinate).  You have increased puffiness (swelling) in your face, hands, legs, or ankles. Get help right away if:  You have a fever.  You are leaking fluid from your vagina.  You have spotting or bleeding from your vagina.  You have very bad belly cramping or pain.  You gain or lose weight rapidly.  You throw up blood. It may look like coffee grounds.  You are around people who have [place] measles, fifth disease, or chickenpox.  You have a very bad headache.  You have shortness of breath.  You have any kind of trauma, such as from a fall or a car accident. Summary  The first trimester of pregnancy is from week 1 until the end of week 13 (months 1 through 3).  To take care of yourself and your unborn baby, you will need to eat healthy meals, take medicines only if your doctor tells you to do so, and do activities that are safe for you and your baby.  Keep all follow-up visits as told by your doctor. This is important as your doctor will have to ensure that your baby is healthy and growing well. This information is not intended to replace advice given to you by your health care provider. Make sure you discuss any questions you have with your health care provider. Document Released: 07/06/2007 Document Revised: 01/26/2016 Document Reviewed: 01/26/2016 Elsevier Interactive Patient Education   2019 Elsevier Inc.  Morning Sickness  Morning sickness is when you feel sick to your stomach (nauseous) during pregnancy. You may feel sick to your stomach and throw up (vomit). You may feel sick in the morning, but you can feel this way at any time of day. Some women feel very sick to their stomach and cannot stop throwing up (hyperemesis gravidarum). Follow these instructions at home: Medicines  Take over-the-counter and prescription medicines only as told by your doctor. Do not take any medicines until you talk with your doctor about them first.  Taking multivitamins before getting pregnant can stop or lessen the harshness of morning sickness. Eating and drinking  Eat dry toast or crackers before getting out of bed.  Eat 5 or 6 small meals a day.  Eat dry and bland foods like rice and baked potatoes.  Do not eat greasy, fatty, or spicy foods.  Have someone cook for you if the smell of food causes you to feel sick or throw up.  If you feel sick to your stomach after taking prenatal vitamins, take them at night or with a snack.  Eat protein when you need a snack. Nuts, yogurt, and cheese are good choices.  Drink fluids throughout the day.  Try ginger ale made with real ginger, ginger tea made from fresh grated ginger, or ginger candies. General instructions  Do not use any products that have nicotine or tobacco in them, such as cigarettes and e-cigarettes. If you need help quitting, ask your doctor.  Use an air purifier to keep the air in your house free of smells.  Get lots of fresh air.  Try to avoid smells that make you feel sick.  Try: ? Wearing a bracelet that is used for seasickness (acupressure wristband). ? Going to a doctor who puts thin needles into certain body points (acupuncture) to improve how you feel.  Contact a doctor if:  You need medicine to feel better.  You feel dizzy or light-headed.  You are losing weight. Get help right away if:  You feel  very sick to your stomach and cannot stop throwing up.  You pass out (faint).  You have very bad pain in your belly. Summary  Morning sickness is when you feel sick to your stomach (nauseous) during pregnancy.  You may feel sick in the morning, but you can feel this way at any time of day.  Making some changes to what you eat may help your symptoms go away. This information is not intended to replace advice given to you by your health care provider. Make sure you discuss any questions you have with your health care provider. Document Released: 02/25/2004 Document Revised: 02/18/2016 Document Reviewed: 02/18/2016 Elsevier Interactive Patient Education  2019 [employer].  Preparing for Vaginal Birth After Cesarean Delivery Vaginal birth after cesarean delivery (VBAC) is giving birth vaginally after previously delivering a baby through a cesarean section (C-section). You and your health are provider will discuss your options and whether you may be a good candidate for VBAC. What are my options? After a cesarean delivery, your options for future deliveries may include:  Scheduled repeat cesarean delivery. This is done in a hospital with an operating room.  Trial of labor after cesarean (TOLAC). A successful TOLAC results in a vaginal delivery. If it is not successful, you will need to have a cesarean delivery. TOLAC should be attempted in facilities where an emergency cesarean delivery can be performed. It should not be done as a home birth. Talk with your health care provider about the risks and benefits of each option early in your pregnancy. The best option for you will depend on your preferences and your overall health as well as your baby's. What should I know about my past cesarean delivery? It is important to know what type of incision was made in your uterus in a past cesarean delivery. The type of incision can affect the success of your TOLAC. Types of incisions include:  Low  transverse. This is a side-to-side cut low on your uterus. The scar on your skin looks like a horizontal line just above your pubic area. This type of cut is the most common and makes you a good candidate for TOLAC.  Low vertical. This is an up-and-down cut low on your uterus. The scar on your skin looks like a vertical line between your pubic area and belly button. This type of cut puts you at higher risk for problems during TOLAC.  High vertical or classical. This is an up-and-down cut high on your uterus. The scar on your skin looks like a vertical line that runs over the top of your belly button. This type of cut has the highest risk for problems and usually means that TOLAC is not an option. When is VBAC not an option? As you progress through your pregnancy, circumstances may change and you may need to reconsider your options. Your situation may also change even as you begin TOLAC. Your health care provider may not want you to attempt a VBAC if you:  Need to have labor started (induced) because your cervix is not ready for labor.  Have never had a vaginal delivery.  Have had more than two cesarean deliveries.  Are overdue.  Are pregnant with a very large baby.  Have a condition that causes high blood pressure (preeclampsia). Questions to ask your health care  provider  Am I a good candidate for TOLAC?  What are my chances of a successful vaginal delivery?  Is my preferred birth location equipped for a TOLAC?  What are my pain management options during a TOLAC? Where to find more information  American Congress of Obstetricians and Gynecologists: www.acog.[place]: www.midwife.org Summary  Vaginal birth after cesarean delivery (VBAC) is giving birth vaginally after previously delivering a baby through a cesarean section (C-section).  VBAC may be a safe and appropriate option for you depending on your medical history and other risk factors. Talk  with your health care provider about the options available to you, and the risks and benefits of each early in your pregnancy.  TOLAC should be attempted in facilities where emergency cesarean section procedures can be performed. This information is not intended to replace advice given to you by your health care provider. Make sure you discuss any questions you have with your health care provider. Document Released: 10/05/2010 Document Revised: 04/28/2016 Document Reviewed: 04/28/2016 Elsevier Interactive Patient Education  2019 [employer].  Vaginal Birth After Cesarean Delivery  Vaginal birth after cesarean delivery (VBAC) is giving birth vaginally after previously delivering a baby through a cesarean section (C-section). A VBAC may be a safe option for you, depending on your health and other factors. It is important to discuss VBAC with your health care provider early in your pregnancy so you can understand the risks, benefits, and options. Having these discussions early will give you time to make your birth plan. Who are the best candidates for VBAC? The best candidates for VBAC are women who:  Have had one or two prior cesarean deliveries, and the incision made during the delivery was horizontal (low transverse).  Do not have a vertical (classical) scar on their uterus.  Have not had a tear in the wall of their uterus (uterine rupture).  Plan to have more pregnancies. A VBAC is also more likely to be successful:  In women who have previously given birth vaginally.  When labor starts by itself (spontaneously) before the due date. What are the benefits of VBAC? The benefits of delivering your baby vaginally instead of by a cesarean delivery include:  A shorter hospital stay.  A faster recovery time.  Less pain.  Avoiding risks associated with major surgery, such as infection and blood clots.  Less blood loss and less need for donated blood (transfusions). What are the risks  of VBAC? The main risk of attempting a VBAC is that it may fail, forcing your health care provider to deliver your baby by a C-section. Other risks are rare and include:  Tearing (rupture) of the scar from a past cesarean delivery.  Other risks associated with vaginal deliveries. If a repeat cesarean delivery is needed, the risks include:  Blood loss.  Infection.  Blood clot.  Damage to surrounding organs.  Removal of the uterus (hysterectomy), if it is damaged.  Placenta problems in future pregnancies. What else should I know about my options? Delivering a baby through a VBAC is similar to having a normal spontaneous vaginal delivery. Therefore, it is safe:  To try with twins.  For your health care provider to try to turn the baby from a breech position (external cephalic version) during labor.  With epidural analgesia for pain relief. Consider where you would like to deliver your baby. VBAC should be attempted in facilities where an emergency cesarean delivery can be performed. VBAC is not recommended for home  births. Any changes in your health or your baby's health during your pregnancy may make it necessary to change your initial decision about VBAC. Your health care provider may recommend that you do not attempt a VBAC if:  Your baby's suspected weight is 8.8 lb (4 kg) or more.  You have preeclampsia. This is a condition that causes high blood pressure along with other symptoms, such as swelling and headaches.  You will have VBAC less than 19 months after your cesarean delivery.  You are past your due date.  You need to have labor started (induced) because your cervix is not ready for labor (unfavorable). Where to find more information  American Pregnancy Association: americanpregnancy.org  [employer] of Obstetricians and Gynecologists: acog.org Summary  Vaginal birth after cesarean delivery (VBAC) is giving birth vaginally after previously delivering a baby  through a cesarean section (C-section). A VBAC may be a safe option for you, depending on your health and other factors.  Discuss VBAC with your health care provider early in your pregnancy so you can understand the risks, benefits, options, and have plenty of time to make your birth plan.  The main risk of attempting a VBAC is that it may fail, forcing your health care provider to deliver your baby by a C-section. Other risks are rare. This information is not intended to replace advice given to you by your health care provider. Make sure you discuss any questions you have with your health care provider. Document Released: 07/10/2006 Document Revised: 04/26/2016 Document Reviewed: 04/26/2016 Elsevier Interactive Patient Education  2019 [employer].

## 2018-04-05 NOTE — Progress Notes (Signed)
GYN ENCOUNTER NOTE  Subjective:       Tammie Burke is a 28 y.o. G79P0101 female here for pregnancy confirmation.   Endorses breast tenderness and nausea without vomiting. No taking a prenatal vitamin. History significant for previous cesarean section for arrest of descent after preterm labor at 36+2 weeks.   Desires trial of labor and midwifery care.   Denies difficulty breathing or respiratory distress, chest pain, abdominal pain, vaginal bleeding, dysuria, and leg pain or swelling.    Gynecologic History  Patient's last menstrual period was 02/17/2018.  Contraception: none  Last Pap: 08/2016. Results were: ASCUS/HPV negative  Obstetric History  OB History  Gravida Para Term Preterm AB Living  2 1 0 1   1  SAB TAB Ectopic Multiple Live Births          1    # Outcome Date GA Lbr Len/2nd Weight Sex Delivery Anes PTL Lv  2 Current           1 Preterm 02/19/16 [redacted]w[redacted]d  5 lb 1 oz (2.296 kg) F CS-LTranv  Y LIV     Complications: Failure to Progress in Second Stage    Past Medical History:  Diagnosis Date  . Anemia   . 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 CESAREAN SECTION N/A 02/19/2016   Procedure: CESAREAN SECTION;  Surgeon: Conard Novak, MD;  Location: [facility] ORS;  Service: Obstetrics;  Laterality: N/A;    No Known Allergies  Social History   Socioeconomic History  . Marital status: Married    Spouse name: Solicitor  . Number of children: 1  . Years of education: Not on file  . Highest education level: Some college, no degree  Occupational History  . Occupation: CNA  Social Needs  . Financial resource strain: Not on file  . Food insecurity:    Worry: Not on file    Inability: Not on file  . Transportation needs:    Medical: Not on file    Non-medical: Not on file  Tobacco Use  . Smoking status: Never Smoker  . Smokeless tobacco: Never Used  Substance and Sexual Activity  . Alcohol use: No  . Drug  use: No  . Sexual activity: Yes    Birth control/protection: None  Lifestyle  . Physical activity:    Days per week: Not on file    Minutes per session: Not on file  . Stress: Not on file  Relationships  . Social connections:    Talks on phone: Not on file    Gets together: Not on file    Attends religious service: Not on file    Active member of club or organization: Not on file    Attends meetings of clubs or organizations: Not on file    Relationship status: Not on file  . Intimate partner violence:    Fear of current or ex partner: Not on file    Emotionally abused: Not on file    Physically abused: Not on file    Forced sexual activity: Not on file  Other Topics Concern  . Not on file  Social History Narrative  . Not on file    Family History  Problem Relation Age of Onset  . Healthy Mother   . Healthy Sister   . Healthy Brother   . Cancer Neg Hx   . Diabetes Neg Hx   . Hypertension Neg Hx   . Thyroid disease Neg  Hx     The following portions of the patient's history were reviewed and updated as appropriate: allergies, current medications, past family history, past medical history, past social history, past surgical history and problem list.  Review of Systems  ROS negative except as noted above. Information obtained from patient.   Objective:   BP 117/70   Pulse 84   Ht 5\' 7"  (1.702 m)   Wt 122 lb (55.3 kg)   LMP 02/17/2018   BMI 19.11 kg/m    CONSTITUTIONAL: Well-developed, well-nourished female in no acute distress.   Recent Results (from the past 2160 hour(s))  POCT urine pregnancy     Status: Abnormal   Collection Time: 04/05/18  1:57 PM  Result Value Ref Range   Preg Test, Ur Positive (A) Negative   Assessment:   1. Missed menses  - POCT urine pregnancy - US OB Transvaginal; Future  2. Previous cesarean section  Plan:   Encouraged prenatal vitamin with folic acid and DHA; samples given.   First trimester education; see AVS.    Reviewed red flag symptoms and when to call.   RTC x 2-4 weeks for nurse intake. RTC x 6-8 for NOB PE.   Will schedule dating and viability ultrasound at [facility].    Gunnar Bulla, CNM [facility], [facility] 04/05/18 2:16 PM

## 2018-04-20 ENCOUNTER — Ambulatory Visit (INDEPENDENT_AMBULATORY_CARE_PROVIDER_SITE_OTHER): Payer: Managed Care, Other (non HMO) | Admitting: Certified Nurse Midwife

## 2018-04-20 ENCOUNTER — Other Ambulatory Visit: Payer: Self-pay

## 2018-04-20 VITALS — BP 117/80 | HR 90 | Ht 67.0 in | Wt 115.4 lb

## 2018-04-20 DIAGNOSIS — Z3401 Encounter for supervision of normal first pregnancy, first trimester: Secondary | ICD-10-CM

## 2018-04-20 LAB — OB RESULTS CONSOLE VARICELLA ZOSTER ANTIBODY, IGG: Varicella: IMMUNE

## 2018-04-20 NOTE — Progress Notes (Signed)
Tammie Burke presents for NOB nurse interview visit. Pregnancy confirmation done _03/05/2020_________. G2. P1001-. Pregnancy education material explained and given. __0__ cats in home. NOB labs ordered.  Sickle cell ordered due to patient's race. HIV labs and drug screen were explained and ordered. PNV encouraged. Genetic screening options discussed. Genetic testing:Unsure. Patient may discuss with the provider. Patient to follow up with provider 05/21/18 for NOB physical. All questions answered.

## 2018-04-21 LAB — URINALYSIS, ROUTINE W REFLEX MICROSCOPIC
Bilirubin, UA: NEGATIVE
Glucose, UA: NEGATIVE
LEUKOCYTES UA: NEGATIVE
NITRITE UA: NEGATIVE
RBC, UA: NEGATIVE
Urobilinogen, Ur: 0.2 mg/dL (ref 0.2–1.0)
pH, UA: 5 (ref 5.0–7.5)

## 2018-04-22 LAB — GC/CHLAMYDIA PROBE AMP
Chlamydia trachomatis, NAA: NEGATIVE
Neisseria gonorrhoeae by PCR: NEGATIVE

## 2018-04-22 LAB — CULTURE, OB URINE

## 2018-04-22 LAB — URINE CULTURE, OB REFLEX

## 2018-04-23 LAB — VARICELLA ZOSTER ANTIBODY, IGG: VARICELLA: 1502 {index} (ref >165)

## 2018-04-23 LAB — PARVOVIRUS B19 ANTIBODY, IGG AND IGM
PARVOVIRUS B19 IGM: 0.1 {index} (ref 0.0–0.8)
Parvovirus B19 IgG: 7.1 index — ABNORMAL HIGH (ref 0.0–0.8)

## 2018-04-23 LAB — RPR: RPR Ser Ql: NONREACTIVE

## 2018-04-23 LAB — ANTIBODY SCREEN: Antibody Screen: NEGATIVE

## 2018-04-23 LAB — HEPATITIS B SURFACE ANTIGEN: Hepatitis B Surface Ag: NEGATIVE

## 2018-04-23 LAB — RUBELLA SCREEN: Rubella Antibodies, IGG: 16.9 index (ref >0.99)

## 2018-04-23 LAB — HIV ANTIBODY (ROUTINE TESTING W REFLEX): HIV Screen 4th Generation wRfx: NONREACTIVE

## 2018-04-23 LAB — HGB SOLU + RFLX FRAC: SICKLE SOLUBILITY TEST - HGBRFX: NEGATIVE

## 2018-04-23 LAB — ABO AND RH: RH TYPE: POSITIVE

## 2018-05-03 ENCOUNTER — Other Ambulatory Visit: Payer: Self-pay

## 2018-05-03 ENCOUNTER — Ambulatory Visit (INDEPENDENT_AMBULATORY_CARE_PROVIDER_SITE_OTHER): Payer: Managed Care, Other (non HMO)

## 2018-05-03 DIAGNOSIS — N8312 Corpus luteum cyst of left ovary: Secondary | ICD-10-CM | POA: Diagnosis not present

## 2018-05-03 DIAGNOSIS — Z3A12 12 weeks gestation of pregnancy: Secondary | ICD-10-CM | POA: Diagnosis not present

## 2018-05-03 DIAGNOSIS — O3481 Maternal care for other abnormalities of pelvic organs, first trimester: Secondary | ICD-10-CM | POA: Diagnosis not present

## 2018-05-03 DIAGNOSIS — N926 Irregular menstruation, unspecified: Secondary | ICD-10-CM

## 2018-05-11 ENCOUNTER — Encounter: Payer: Self-pay | Admitting: Family Medicine

## 2018-05-21 ENCOUNTER — Encounter: Payer: Managed Care, Other (non HMO) | Admitting: Certified Nurse Midwife

## 2018-05-31 ENCOUNTER — Other Ambulatory Visit: Payer: Self-pay

## 2018-05-31 ENCOUNTER — Ambulatory Visit (INDEPENDENT_AMBULATORY_CARE_PROVIDER_SITE_OTHER): Payer: Managed Care, Other (non HMO) | Admitting: Certified Nurse Midwife

## 2018-05-31 ENCOUNTER — Encounter: Payer: Self-pay | Admitting: Certified Nurse Midwife

## 2018-05-31 ENCOUNTER — Other Ambulatory Visit (HOSPITAL_COMMUNITY)
Admission: RE | Admit: 2018-05-31 | Discharge: 2018-05-31 | Disposition: A | Discharge status: Home / Self Care | Payer: Managed Care, Other (non HMO) | Source: Ambulatory Visit | Attending: Certified Nurse Midwife | Admitting: Certified Nurse Midwife

## 2018-05-31 VITALS — BP 105/65 | HR 82 | Wt 119.1 lb

## 2018-05-31 DIAGNOSIS — O34219 Maternal care for unspecified type scar from previous cesarean delivery: Secondary | ICD-10-CM

## 2018-05-31 DIAGNOSIS — O26892 Other specified pregnancy related conditions, second trimester: Secondary | ICD-10-CM

## 2018-05-31 DIAGNOSIS — Z98891 History of uterine scar from previous surgery: Secondary | ICD-10-CM | POA: Insufficient documentation

## 2018-05-31 DIAGNOSIS — O09292 Supervision of pregnancy with other poor reproductive or obstetric history, second trimester: Secondary | ICD-10-CM

## 2018-05-31 DIAGNOSIS — Z3492 Encounter for supervision of normal pregnancy, unspecified, second trimester: Secondary | ICD-10-CM

## 2018-05-31 DIAGNOSIS — Z8751 Personal history of pre-term labor: Secondary | ICD-10-CM

## 2018-05-31 DIAGNOSIS — O09212 Supervision of pregnancy with history of pre-term labor, second trimester: Secondary | ICD-10-CM

## 2018-05-31 DIAGNOSIS — Z8759 Personal history of other complications of pregnancy, childbirth and the puerperium: Secondary | ICD-10-CM

## 2018-05-31 DIAGNOSIS — N898 Other specified noninflammatory disorders of vagina: Secondary | ICD-10-CM

## 2018-05-31 DIAGNOSIS — Z3A16 16 weeks gestation of pregnancy: Secondary | ICD-10-CM

## 2018-05-31 HISTORY — DX: History of uterine scar from previous surgery: Z98.891

## 2018-05-31 LAB — POCT URINALYSIS DIPSTICK OB
Bilirubin, UA: NEGATIVE
Blood, UA: NEGATIVE
Glucose, UA: NEGATIVE
Ketones, UA: NEGATIVE
Nitrite, UA: NEGATIVE
Spec Grav, UA: 1.015 (ref 1.010–1.025)
Urobilinogen, UA: 0.2 E.U./dL
pH, UA: 6 (ref 5.0–8.0)

## 2018-05-31 MED ORDER — VITAFOL GUMMIES 3.33-0.333-34.8 MG PO CHEW: 3.0000 | CHEWABLE_TABLET | Freq: Every day | ORAL | 10 refills | Status: DC

## 2018-05-31 NOTE — Progress Notes (Signed)
NEW OB HISTORY AND PHYSICAL  SUBJECTIVE:       Tammie Burke is a 28 y.o. G72P0101 female, Patient's last menstrual period was 02/17/2018., Estimated Date of Delivery: 11/14/18, [redacted]w[redacted]d, presents today for establishment of Prenatal Care.  Reports resolving nausea with intermittent vomiting and breast tenderness.   Denies difficulty breathing or respiratory distress, chest pain, abdominal pain, vaginal bleeding, dysuria, and leg pain or swelling.   History significant for preterm labor and birth at [redacted]w[redacted]d. History significant for previous cesarean section due to arrest of descent after three (3) hours of pushing. Cesarean section preformed by [facility] of [facility].   Desires midwifery care for trial of labor. Declines genetic screening. Prefers postpartum pap smear.    Gynecologic History  Patient's last menstrual period was 02/17/2018.   Contraception: none  Last Pap: 08/2016. Results were: AS-CUS/HPV negative  Obstetric History  OB History  Gravida Para Term Preterm AB Living  2 1 0 1   1  SAB TAB Ectopic Multiple Live Births          1    # Outcome Date GA Lbr Len/2nd Weight Sex Delivery Anes PTL Lv  2 Current           1 Preterm 02/19/16 [redacted]w[redacted]d  5 lb 1 oz (2.296 kg) F CS-LTranv  Y LIV     Complications: Failure to Progress in Second Stage    Past Medical History:  Diagnosis Date  . Anemia   . 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 CESAREAN SECTION N/A 02/19/2016   Procedure: CESAREAN SECTION;  Surgeon: Conard Novak, MD;  Location: [facility] ORS;  Service: Obstetrics;  Laterality: N/A;   No Known Allergies  Social History   Socioeconomic History  . Marital status: Married    Spouse name: Solicitor  . Number of children: 1  . Years of education: Not on file  . Highest education level: Some college, no degree  Occupational History  . Occupation: CNA  Social Needs  . Financial resource strain: Not on file  .  Food insecurity:    Worry: Not on file    Inability: Not on file  . Transportation needs:    Medical: Not on file    Non-medical: Not on file  Tobacco Use  . Smoking status: Never Smoker  . Smokeless tobacco: Never Used  Substance and Sexual Activity  . Alcohol use: No  . Drug use: No  . Sexual activity: Yes    Birth control/protection: None  Lifestyle  . Physical activity:    Days per week: Not on file    Minutes per session: Not on file  . Stress: Not on file  Relationships  . Social connections:    Talks on phone: Not on file    Gets together: Not on file    Attends religious service: Not on file    Active member of club or organization: Not on file    Attends meetings of clubs or organizations: Not on file    Relationship status: Not on file  . Intimate partner violence:    Fear of current or ex partner: Not on file    Emotionally abused: Not on file    Physically abused: Not on file    Forced sexual activity: Not on file  Other Topics Concern  . Not on file  Social History Narrative  . Not on file    Family History  Problem Relation Age  of Onset  . Healthy Mother   . Healthy Sister   . Healthy Brother   . Cancer Neg Hx   . Diabetes Neg Hx   . Hypertension Neg Hx   . Thyroid disease Neg Hx     The following portions of the patient's history were reviewed and updated as appropriate: allergies, current medications, past OB history, past medical history, past surgical history, past family history, past social history, and problem list.  Review of Systems  ROS negative except as noted above. Information obtained from patient.   OBJECTIVE:  BP 105/65   Pulse 82   Wt 119 lb 1.6 oz (54 kg)   LMP 02/17/2018   BMI 18.65 kg/m   Initial Physical Exam (New OB)  GENERAL APPEARANCE: alert, well appearing, in no apparent distress  HEAD: normocephalic, atraumatic  MOUTH: mucous membranes moist, pharynx normal without lesions  THYROID: no thyromegaly or  masses present  BREASTS: no masses noted, no significant tenderness, no palpable axillary nodes, no skin changes  LUNGS: clear to auscultation, no wheezes, rales or rhonchi, symmetric air entry  HEART: regular rate and rhythm, no murmurs  ABDOMEN: soft, nontender, nondistended, no abnormal masses, no epigastric pain, fundus soft, nontender 16 weeks size and FHT present  EXTREMITIES: no redness or tenderness in the calves or thighs, no edema  SKIN: normal coloration and turgor, no rashes  LYMPH NODES: no adenopathy palpable  NEUROLOGIC: alert, oriented, normal speech, no focal findings or movement disorder noted  PELVIC EXAM EXTERNAL GENITALIA: normal appearing vulva with no masses, tenderness or lesions VAGINA: thick/thin white discharge present, vaginal swab collected CERVIX: no lesions or cervical motion tenderness UTERUS: gravid and consistent with 16 weeks ADNEXA: no masses palpable and nontender OB EXAM PELVIMETRY: appears adequate  ASSESSMENT: Normal pregnancy Rh positive History preterm labor and birth at 7947w2d History previous cesarean section due to arrest of descent  Declines genetic screening  PLAN: Prenatal care New OB counseling: The patient has been given an overview regarding routine prenatal care. Recommendations regarding diet, weight gain, and exercise in pregnancy were given. Prenatal testing, optional genetic testing, and ultrasound use in pregnancy were reviewed.  Benefits of Breast Feeding were discussed. The patient is encouraged to consider nursing her baby post partum. Discussed risks vs benefits of TOLAC vs repeat C-section.  Risk of uterine rupture at term is ~ 1%.  Risk of failed trial of labor after cesarean (TOLAC) without a vaginal birth after cesarean (VBAC) resulting in repeat cesarean delivery (RCD) in about 20 to 40 percent of women who attempt VBAC.  Risk of requiring emergency C-section due to uterine rupture discussed. The benefits of a  trial of labor after cesarean (TOLAC) resulting in a vaginal birth after cesarean (VBAC) include the following: shorter length of hospital stay and postpartum recovery (in most cases); fewer complications, such as postpartum fever, wound or uterine infection, thromboembolism (blood clots in the leg or lung), need for blood transfusion and fewer neonatal breathing problems.  Patient notes understanding.  Still desires TOLAC.  See orders

## 2018-05-31 NOTE — Progress Notes (Signed)
Patient here for NOB physical exam, c/o white vaginal d/c that started with pregnancy.

## 2018-05-31 NOTE — Patient Instructions (Addendum)
Vaginal Birth After Cesarean Delivery  Vaginal birth after cesarean delivery (VBAC) is giving birth vaginally after previously delivering a baby through a cesarean section (C-section). A VBAC may be a safe option for you, depending on your health and other factors. It is important to discuss VBAC with your health care provider early in your pregnancy so you can understand the risks, benefits, and options. Having these discussions early will give you time to make your birth plan. Who are the best candidates for VBAC? The best candidates for VBAC are women who:  Have had one or two prior cesarean deliveries, and the incision made during the delivery was horizontal (low transverse).  Do not have a vertical (classical) scar on their uterus.  Have not had a tear in the wall of their uterus (uterine rupture).  Plan to have more pregnancies. A VBAC is also more likely to be successful:  In women who have previously given birth vaginally.  When labor starts by itself (spontaneously) before the due date. What are the benefits of VBAC? The benefits of delivering your baby vaginally instead of by a cesarean delivery include:  A shorter hospital stay.  A faster recovery time.  Less pain.  Avoiding risks associated with major surgery, such as infection and blood clots.  Less blood loss and less need for donated blood (transfusions). What are the risks of VBAC? The main risk of attempting a VBAC is that it may fail, forcing your health care provider to deliver your baby by a C-section. Other risks are rare and include:  Tearing (rupture) of the scar from a past cesarean delivery.  Other risks associated with vaginal deliveries. If a repeat cesarean delivery is needed, the risks include:  Blood loss.  Infection.  Blood clot.  Damage to surrounding organs.  Removal of the uterus (hysterectomy), if it is damaged.  Placenta problems in future pregnancies. What else should I know  about my options? Delivering a baby through a VBAC is similar to having a normal spontaneous vaginal delivery. Therefore, it is safe:  To try with twins.  For your health care provider to try to turn the baby from a breech position (external cephalic version) during labor.  With epidural analgesia for pain relief. Consider where you would like to deliver your baby. VBAC should be attempted in facilities where an emergency cesarean delivery can be performed. VBAC is not recommended for home births. Any changes in your health or your baby's health during your pregnancy may make it necessary to change your initial decision about VBAC. Your health care provider may recommend that you do not attempt a VBAC if:  Your baby's suspected weight is 8.8 lb (4 kg) or more.  You have preeclampsia. This is a condition that causes high blood pressure along with other symptoms, such as swelling and headaches.  You will have VBAC less than 19 months after your cesarean delivery.  You are past your due date.  You need to have labor started (induced) because your cervix is not ready for labor (unfavorable). Where to find more information  American Pregnancy Association: americanpregnancy.org  [employer] of Obstetricians and Gynecologists: acog.org Summary  Vaginal birth after cesarean delivery (VBAC) is giving birth vaginally after previously delivering a baby through a cesarean section (C-section). A VBAC may be a safe option for you, depending on your health and other factors.  Discuss VBAC with your health care provider early in your pregnancy so you can understand the risks, benefits, options, and  have plenty of time to make your birth plan.  The main risk of attempting a VBAC is that it may fail, forcing your health care provider to deliver your baby by a C-section. Other risks are rare. This information is not intended to replace advice given to you by your health care provider. Make sure  you discuss any questions you have with your health care provider. Document Released: 07/10/2006 Document Revised: 04/26/2016 Document Reviewed: 04/26/2016 Elsevier Interactive Patient Education  2019 [employer]. Preparing for Vaginal Birth After Cesarean Delivery Vaginal birth after cesarean delivery (VBAC) is giving birth vaginally after previously delivering a baby through a cesarean section (C-section). You and your health are provider will discuss your options and whether you may be a good candidate for VBAC. What are my options? After a cesarean delivery, your options for future deliveries may include:  Scheduled repeat cesarean delivery. This is done in a hospital with an operating room.  Trial of labor after cesarean (TOLAC). A successful TOLAC results in a vaginal delivery. If it is not successful, you will need to have a cesarean delivery. TOLAC should be attempted in facilities where an emergency cesarean delivery can be performed. It should not be done as a home birth. Talk with your health care provider about the risks and benefits of each option early in your pregnancy. The best option for you will depend on your preferences and your overall health as well as your baby's. What should I know about my past cesarean delivery? It is important to know what type of incision was made in your uterus in a past cesarean delivery. The type of incision can affect the success of your TOLAC. Types of incisions include:  Low transverse. This is a side-to-side cut low on your uterus. The scar on your skin looks like a horizontal line just above your pubic area. This type of cut is the most common and makes you a good candidate for TOLAC.  Low vertical. This is an up-and-down cut low on your uterus. The scar on your skin looks like a vertical line between your pubic area and belly button. This type of cut puts you at higher risk for problems during TOLAC.  High vertical or classical. This is an  up-and-down cut high on your uterus. The scar on your skin looks like a vertical line that runs over the top of your belly button. This type of cut has the highest risk for problems and usually means that TOLAC is not an option. When is VBAC not an option? As you progress through your pregnancy, circumstances may change and you may need to reconsider your options. Your situation may also change even as you begin TOLAC. Your health care provider may not want you to attempt a VBAC if you:  Need to have labor started (induced) because your cervix is not ready for labor.  Have never had a vaginal delivery.  Have had more than two cesarean deliveries.  Are overdue.  Are pregnant with a very large baby.  Have a condition that causes high blood pressure (preeclampsia). Questions to ask your health care provider  Am I a good candidate for TOLAC?  What are my chances of a successful vaginal delivery?  Is my preferred birth location equipped for a TOLAC?  What are my pain management options during a TOLAC? Where to find more information  American Congress of Obstetricians and Gynecologists: www.acog.[place]: www.midwife.org Summary  Vaginal birth after cesarean delivery (  VBAC) is giving birth vaginally after previously delivering a baby through a cesarean section (C-section).  VBAC may be a safe and appropriate option for you depending on your medical history and other risk factors. Talk with your health care provider about the options available to you, and the risks and benefits of each early in your pregnancy.  TOLAC should be attempted in facilities where emergency cesarean section procedures can be performed. This information is not intended to replace advice given to you by your health care provider. Make sure you discuss any questions you have with your health care provider. Document Released: 10/05/2010 Document Revised: 04/28/2016 Document Reviewed:  04/28/2016 Elsevier Interactive Patient Education  2019 [employer].   Common Medications Safe in Pregnancy  Acne:      Constipation:  Benzoyl Peroxide     Colace  Clindamycin      Dulcolax Suppository  Topica Erythromycin     Fibercon  Salicylic Acid      Metamucil         Miralax AVOID:        Senakot   Accutane    Cough:  Retin-A       Cough Drops  Tetracycline      Phenergan w/ Codeine if Rx  Minocycline      Robitussin (Plain & DM)  Antibiotics:     Crabs/Lice:  Ceclor       RID  Cephalosporins    AVOID:  E-Mycins      Kwell  Keflex  Macrobid/Macrodantin   Diarrhea:  Penicillin      Kao-Pectate  Zithromax      Imodium AD         PUSH FLUIDS AVOID:       Cipro     Fever:  Tetracycline      Tylenol (Regular or Extra  Minocycline       Strength)  Levaquin      Extra Strength-Do not          Exceed 8 tabs/24 hrs Caffeine:        <253m/day (equiv. To 1 cup of coffee or  approx. 3 12 oz sodas)         Gas: Cold/Hayfever:       Gas-X  Benadryl      Mylicon  Claritin       Phazyme  **Claritin-D        Chlor-Trimeton    Headaches:  Dimetapp      ASA-Free Excedrin  Drixoral-Non-Drowsy     Cold Compress  Mucinex (Guaifenasin)     Tylenol (Regular or Extra  Sudafed/Sudafed-12 Hour     Strength)  **Sudafed PE Pseudoephedrine   Tylenol Cold & Sinus     Vicks Vapor Rub  Zyrtec  **AVOID if Problems With Blood Pressure         Heartburn: Avoid lying down for at least 1 hour after meals  Aciphex      Maalox     Rash:  Milk of Magnesia     Benadryl    Mylanta       1% Hydrocortisone Cream  Pepcid  Pepcid Complete   Sleep Aids:  Prevacid      Ambien   Prilosec       Benadryl  Rolaids       Chamomile Tea  Tums (Limit 4/day)     Unisom        Tylenol PM         Warm milk-add vanilla or  Hemorrhoids:       Sugar for taste  Anusol/Anusol H.C.  (RX: Analapram 2.5%)  Sugar Substitutes:  Hydrocortisone OTC     Ok in moderation  Preparation H      Tucks         Vaseline lotion applied to tissue with wiping    Herpes:     Throat:  Acyclovir      Oragel  Famvir  Valtrex     Vaccines:         Flu Shot Leg Cramps:       *Gardasil  Benadryl      Hepatitis A         Hepatitis B Nasal Spray:       Pneumovax  Saline Nasal Spray     Polio Booster         Tetanus Nausea:       Tuberculosis test or PPD  Vitamin B6 25 mg TID   AVOID:    Dramamine      *Gardasil  Emetrol       Live Poliovirus  Ginger Root 250 mg QID    MMR (measles, mumps &  High Complex Carbs @ Bedtime    rebella)  Sea Bands-Accupressure    Varicella (Chickenpox)  Unisom 1/2 tab TID     *No known complications           If received before Pain:         Known pregnancy;   Darvocet       Resume series after  Lortab        Delivery  Percocet    Yeast:   Tramadol      Femstat  Tylenol 3      Gyne-lotrimin  Ultram       Monistat  Vicodin           MISC:         All Sunscreens           Hair Coloring/highlights          Insect Repellant's          (Including DEET)         Mystic Tans Second Trimester of Pregnancy  The second trimester is from week 14 through week 27 (month 4 through 6). This is often the time in pregnancy that you feel your best. Often times, morning sickness has lessened or quit. You may have more energy, and you may get hungry more often. Your unborn baby is growing rapidly. At the end of the sixth month, he or she is about 9 inches long and weighs about 1 pounds. You will likely feel the baby move between 18 and 20 weeks of pregnancy. Follow these instructions at home: Medicines  Take over-the-counter and prescription medicines only as told by your doctor. Some medicines are safe and some medicines are not safe during pregnancy.  Take a prenatal vitamin that contains at least 600 micrograms (mcg) of folic acid.  If you have trouble pooping (constipation), take medicine that will make your stool soft (stool softener) if your doctor approves. Eating and  drinking   Eat regular, healthy meals.  Avoid raw meat and uncooked cheese.  If you get low calcium from the food you eat, talk to your doctor about taking a daily calcium supplement.  Avoid foods that are high in fat and sugars, such as fried and sweet foods.  If you feel sick to your stomach (nauseous) or throw up (vomit): ?  Eat 4 or 5 small meals a day instead of 3 large meals. ? Try eating a few soda crackers. ? Drink liquids between meals instead of during meals.  To prevent constipation: ? Eat foods that are high in fiber, like fresh fruits and vegetables, whole grains, and beans. ? Drink enough fluids to keep your pee (urine) clear or pale yellow. Activity  Exercise only as told by your doctor. Stop exercising if you start to have cramps.  Do not exercise if it is too hot, too humid, or if you are in a place of great height (high altitude).  Avoid heavy lifting.  Wear low-heeled shoes. Sit and stand up straight.  You can continue to have sex unless your doctor tells you not to. Relieving pain and discomfort  Wear a good support bra if your breasts are tender.  Take warm water baths (sitz baths) to soothe pain or discomfort caused by hemorrhoids. Use hemorrhoid cream if your doctor approves.  Rest with your legs raised if you have leg cramps or low back pain.  If you develop puffy, bulging veins (varicose veins) in your legs: ? Wear support hose or compression stockings as told by your doctor. ? Raise (elevate) your feet for 15 minutes, 3-4 times a day. ? Limit salt in your food. Prenatal care  Write down your questions. Take them to your prenatal visits.  Keep all your prenatal visits as told by your doctor. This is important. Safety  Wear your seat belt when driving.  Make a list of emergency phone numbers, including numbers for family, friends, the hospital, and police and fire departments. General instructions  Ask your doctor about the right foods to  eat or for help finding a counselor, if you need these services.  Ask your doctor about local prenatal classes. Begin classes before month 6 of your pregnancy.  Do not use hot tubs, steam rooms, or saunas.  Do not douche or use tampons or scented sanitary pads.  Do not cross your legs for long periods of time.  Visit your dentist if you have not done so. Use a soft toothbrush to brush your teeth. Floss gently.  Avoid all smoking, herbs, and alcohol. Avoid drugs that are not approved by your doctor.  Do not use any products that contain nicotine or tobacco, such as cigarettes and e-cigarettes. If you need help quitting, ask your doctor.  Avoid cat litter boxes and soil used by cats. These carry germs that can cause birth defects in the baby and can cause a loss of your baby (miscarriage) or stillbirth. Contact a doctor if:  You have mild cramps or pressure in your lower belly.  You have pain when you pee (urinate).  You have bad smelling fluid coming from your vagina.  You continue to feel sick to your stomach (nauseous), throw up (vomit), or have watery poop (diarrhea).  You have a nagging pain in your belly area.  You feel dizzy. Get help right away if:  You have a fever.  You are leaking fluid from your vagina.  You have spotting or bleeding from your vagina.  You have severe belly cramping or pain.  You lose or gain weight rapidly.  You have trouble catching your breath and have chest pain.  You notice sudden or extreme puffiness (swelling) of your face, hands, ankles, feet, or legs.  You have not felt the baby move in over an hour.  You have severe headaches that do not go away when  you take medicine.  You have trouble seeing. Summary  The second trimester is from week 14 through week 27 (months 4 through 6). This is often the time in pregnancy that you feel your best.  To take care of yourself and your unborn baby, you will need to eat healthy meals, take  medicines only if your doctor tells you to do so, and do activities that are safe for you and your baby.  Call your doctor if you get sick or if you notice anything unusual about your pregnancy. Also, call your doctor if you need help with the right food to eat, or if you want to know what activities are safe for you. This information is not intended to replace advice given to you by your health care provider. Make sure you discuss any questions you have with your health care provider. Document Released: 04/13/2009 Document Revised: 02/23/2016 Document Reviewed: 02/23/2016 Elsevier Interactive Patient Education  2019 [place] for Pregnant Women While you are pregnant, your body requires additional nutrition to help support your growing baby. You also have a higher need for some vitamins and minerals, such as folic acid, calcium, iron, and vitamin D. Eating a healthy, well-balanced diet is very important for your health and your baby's health. Your need for extra calories varies for the three 16-monthsegments of your pregnancy (trimesters). For most women, it is recommended to consume:  150 extra calories a day during the first trimester.  300 extra calories a day during the second trimester.  300 extra calories a day during the third trimester. What are tips for following this plan?   Do not try to lose weight or go on a diet during pregnancy.  Limit your overall intake of foods that have "empty calories." These are foods that have little nutritional value, such as sweets, desserts, candies, and sugar-sweetened beverages.  Eat a variety of foods (especially fruits and vegetables) to get a full range of vitamins and minerals.  Take a prenatal vitamin to help meet your additional vitamin and mineral needs during pregnancy, specifically for folic acid, iron, calcium, and vitamin D.  Remember to stay active. Ask your health care provider what types of exercise and activities  are safe for you.  Practice good food safety and cleanliness. Wash your hands before you eat and after you prepare raw meat. Wash all fruits and vegetables well before peeling or eating. Taking these actions can help to prevent food-borne illnesses that can be very dangerous to your baby, such as listeriosis. Ask your health care provider for more information about listeriosis. What does 150 extra calories look like? Healthy options that provide 150 extra calories each day could be any of the following:  6-8 oz (170-230 g) of plain low-fat yogurt with  cup of berries.  1 apple with 2 teaspoons (11 g) of peanut butter.  Cut-up vegetables with  cup (60 g) of hummus.  8 oz (230 mL) or 1 cup of low-fat chocolate milk.  1 stick of string cheese with 1 medium orange.  1 peanut butter and jelly sandwich that is made with one slice of whole-wheat bread and 1 tsp (5 g) of peanut butter. For 300 extra calories, you could eat two of those healthy options each day. What is a healthy amount of weight to gain? The right amount of weight gain for you is based on your BMI before you became pregnant. If your BMI:  Was less than 18 (underweight), you should  gain 28-40 lb (13-18 kg).  Was 18-24.9 (normal), you should gain 25-35 lb (11-16 kg).  Was 25-29.9 (overweight), you should gain 15-25 lb (7-11 kg).  Was 30 or greater (obese), you should gain 11-20 lb (5-9 kg). What if I am having twins or multiples? Generally, if you are carrying twins or multiples:  You may need to eat 300-600 extra calories a day.  The recommended range for total weight gain is 25-54 lb (11-25 kg), depending on your BMI before pregnancy.  Talk with your health care provider to find out about nutritional needs, weight gain, and exercise that is right for you. What foods can I eat?  Grains All grains. Choose whole grains, such as whole-wheat bread, oatmeal, or brown rice. Vegetables All vegetables. Eat a variety of  colors and types of vegetables. Remember to wash your vegetables well before peeling or eating. Fruits All fruits. Eat a variety of colors and types of fruit. Remember to wash your fruits well before peeling or eating. Meats and other protein foods Lean meats, including chicken, [place], fish, and lean cuts of beef, veal, or pork. If you eat fish or seafood, choose options that are higher in omega-3 fatty acids and lower in mercury, such as salmon, herring, mussels, trout, sardines, pollock, shrimp, crab, and lobster. Tofu. Tempeh. Beans. Eggs. Peanut butter and other nut butters. Make sure that all meats, poultry, and eggs are cooked to food-safe temperatures or "well-done." Two or more servings of fish are recommended each week in order to get the most benefits from omega-3 fatty acids that are found in seafood. Choose fish that are lower in mercury. You can find more information online:  [employer] Dairy Pasteurized milk and milk alternatives (such as almond milk). Pasteurized yogurt and pasteurized cheese. Cottage cheese. Sour cream. Beverages Water. Juices that contain 100% fruit juice or vegetable juice. Caffeine-free teas and decaffeinated coffee. Drinks that contain caffeine are okay to drink, but it is better to avoid caffeine. Keep your total caffeine intake to less than 200 mg each day (which is 12 oz or 355 mL of coffee, tea, or soda) or the limit as told by your health care provider. Fats and oils Fats and oils are okay to include in moderation. Sweets and desserts Sweets and desserts are okay to include in moderation. Seasoning and other foods All pasteurized condiments. The items listed above may not be a complete list of recommended foods and beverages. Contact your dietitian for more options. What foods are not recommended? Vegetables Raw (unpasteurized) vegetable juices. Fruits Unpasteurized fruit juices. Meats and other protein foods Lunch meats, bologna, hot dogs, or  other deli meats. (If you must eat those meats, reheat them until they are steaming hot.) Refrigerated pat, meat spreads from a meat counter, smoked seafood that is found in the refrigerated section of a store. Raw or undercooked meats, poultry, and eggs. Raw fish, such as sushi or sashimi. Fish that have high mercury content, such as tilefish, shark, swordfish, and king mackerel. To learn more about mercury in fish, talk with your health care provider or look for online resources, such as:  [employer] Dairy Raw (unpasteurized) milk and any foods that have raw milk in them. Soft cheeses, such as feta, queso blanco, queso fresco, Brie, Camembert cheeses, blue-veined cheeses, and Panela cheese (unless it is made with pasteurized milk, which must be stated on the label). Beverages Alcohol. Sugar-sweetened beverages, such as sodas, teas, or energy drinks. Seasoning and other foods Homemade fermented foods and  drinks, such as pickles, sauerkraut, or kombucha drinks. (Store-bought pasteurized versions of these are okay.) Salads that are made in a store or deli, such as ham salad, chicken salad, egg salad, tuna salad, and seafood salad. The items listed above may not be a complete list of foods and beverages to avoid. Contact your dietitian for more information. Where to find more information To calculate the number of calories you need based on your height, weight, and activity level, you can use an online calculator such as:  MobileTransition.ch To calculate how much weight you should gain during pregnancy, you can use an online pregnancy weight gain calculator such as:  StreamingFood.com.cy Summary  While you are pregnant, your body requires additional nutrition to help support your growing baby.  Eat a variety of foods, especially fruits and vegetables to get a full range of vitamins and minerals.  Practice good food safety and cleanliness. Wash  your hands before you eat and after you prepare raw meat. Wash all fruits and vegetables well before peeling or eating. Taking these actions can help to prevent food-borne illnesses, such as listeriosis, that can be very dangerous to your baby.  Do not eat raw meat or fish. Do not eat fish that have high mercury content, such as tilefish, shark, swordfish, and king mackerel. Do not eat unpasteurized (raw) dairy.  Take a prenatal vitamin to help meet your additional vitamin and mineral needs during pregnancy, specifically for folic acid, iron, calcium, and vitamin D. This information is not intended to replace advice given to you by your health care provider. Make sure you discuss any questions you have with your health care provider. Document Released: 11/01/2013 Document Revised: 10/14/2016 Document Reviewed: 10/14/2016 Elsevier Interactive Patient Education  2019 [employer].

## 2018-06-01 DIAGNOSIS — Z8751 Personal history of pre-term labor: Secondary | ICD-10-CM | POA: Insufficient documentation

## 2018-06-01 DIAGNOSIS — Z8759 Personal history of other complications of pregnancy, childbirth and the puerperium: Secondary | ICD-10-CM | POA: Insufficient documentation

## 2018-06-01 HISTORY — DX: Personal history of other complications of pregnancy, childbirth and the puerperium: Z87.59

## 2018-06-01 HISTORY — DX: Personal history of pre-term labor: Z87.51

## 2018-06-05 LAB — CERVICOVAGINAL ANCILLARY ONLY
Bacterial vaginitis: NEGATIVE
Candida vaginitis: POSITIVE — AB

## 2018-06-08 ENCOUNTER — Other Ambulatory Visit: Payer: Self-pay | Admitting: Certified Nurse Midwife

## 2018-06-08 MED ORDER — TERCONAZOLE 0.4 % VA CREA: 1.0000 | TOPICAL_CREAM | Freq: Every day | VAGINAL | 0 refills | Status: DC

## 2018-06-20 ENCOUNTER — Telehealth: Payer: Self-pay | Admitting: Certified Nurse Midwife

## 2018-06-20 NOTE — Telephone Encounter (Signed)
The patient called and requested to speak with a nurse in regards to her needing a note for her employer/COVID-19. Please advise.

## 2018-06-26 ENCOUNTER — Telehealth: Payer: Self-pay

## 2018-06-26 ENCOUNTER — Other Ambulatory Visit: Payer: Self-pay | Admitting: Obstetrics and Gynecology

## 2018-06-26 DIAGNOSIS — Z3492 Encounter for supervision of normal pregnancy, unspecified, second trimester: Secondary | ICD-10-CM

## 2018-06-26 NOTE — Telephone Encounter (Signed)
[facility] for screening 

## 2018-06-27 ENCOUNTER — Ambulatory Visit (INDEPENDENT_AMBULATORY_CARE_PROVIDER_SITE_OTHER): Payer: Managed Care, Other (non HMO)

## 2018-06-27 ENCOUNTER — Other Ambulatory Visit: Payer: Self-pay

## 2018-06-27 ENCOUNTER — Encounter: Payer: Self-pay | Admitting: Certified Nurse Midwife

## 2018-06-27 ENCOUNTER — Ambulatory Visit (INDEPENDENT_AMBULATORY_CARE_PROVIDER_SITE_OTHER): Payer: Managed Care, Other (non HMO) | Admitting: Certified Nurse Midwife

## 2018-06-27 VITALS — BP 111/67 | HR 71 | Wt 124.5 lb

## 2018-06-27 DIAGNOSIS — Z363 Encounter for antenatal screening for malformations: Secondary | ICD-10-CM

## 2018-06-27 DIAGNOSIS — Z3492 Encounter for supervision of normal pregnancy, unspecified, second trimester: Secondary | ICD-10-CM

## 2018-06-27 DIAGNOSIS — Z3A20 20 weeks gestation of pregnancy: Secondary | ICD-10-CM

## 2018-06-27 LAB — POCT URINALYSIS DIPSTICK OB
Bilirubin, UA: NEGATIVE
Blood, UA: NEGATIVE
Glucose, UA: NEGATIVE
Ketones, UA: NEGATIVE
Leukocytes, UA: NEGATIVE
Nitrite, UA: NEGATIVE
POC,PROTEIN,UA: NEGATIVE
Spec Grav, UA: 1.01 (ref 1.010–1.025)
Urobilinogen, UA: 0.2 E.U./dL
pH, UA: 6 (ref 5.0–8.0)

## 2018-06-27 NOTE — Progress Notes (Signed)
ROB and anatomy u/s today . Results reviewed. Consulted with Dr. Valentino Saxon regarding dating. EDD will remain 11/14/18. All questions answered. Follow up 4 wks with Melody.   Doreene Burke, CNM   Patient Name: Tammie Burke DOB: 06-12-1990 MRN: [id number] ULTRASOUND REPORT  Location: [facility] Date of Service: 06/27/2018   Indications:Anatomy Ultrasound Findings:  Mason Jim intrauterine pregnancy is visualized with FHR at 143 BPM. Biometrics give an (U/S) Gestational age of [redacted]w[redacted]d and an (U/S) EDD of 11/27/2018; this does not correlates with the clinically established Estimated Date of Delivery: 11/14/18  Fetal presentation is Breech.  EFW: 223 G ( 8 oz ). Placenta: posterior. Grade: 1. Marginal PCI AFI: subjectively normal.  Anatomic survey is complete and normal; Gender - female.    Right Ovary is normal in appearance. Left Ovary is normal appearance. Survey of the adnexa demonstrates no adnexal masses. There is no free peritoneal fluid in the cul de sac.  Impression: 1. [redacted]w[redacted]d Viable Singleton Intrauterine pregnancy by U/S. 2. (U/S) EDD is not consistent with Clinically established Estimated Date of Delivery: 11/14/18 .[place] EDD is 11/27/2018. 3. Marginal placental cord insertion. 3. Normal Anatomy Scan  Recommendations: 1.Clinical correlation with the patient's History and Physical Exam.   Jenine M. Marciano Sequin   RDMS

## 2018-06-27 NOTE — Progress Notes (Signed)
ROB-Patient c/o vaginal spotting after intercourse 2 days ago.

## 2018-06-27 NOTE — Progress Notes (Signed)
ROB and anatomy scan today.

## 2018-07-26 ENCOUNTER — Telehealth: Payer: Self-pay | Admitting: *Deleted

## 2018-07-26 NOTE — Telephone Encounter (Signed)
Coronavirus (COVID-19) Are you at risk?  Are you at risk for the Coronavirus (COVID-19)?  To be considered HIGH RISK for Coronavirus (COVID-19), you have to meet the following criteria:  . Traveled to [place], [place], [place], [place] or [place]; or in the [place] to [place], [place], [place], or [place]; and have fever, cough, and shortness of breath within the last 2 weeks of travel OR . Been in close contact with a person diagnosed with COVID-19 within the last 2 weeks and have fever, cough, and shortness of breath . IF YOU DO NOT MEET THESE CRITERIA, YOU ARE CONSIDERED LOW RISK FOR COVID-19.  What to do if you are HIGH RISK for COVID-19?  . If you are having a medical emergency, call 911. . Seek medical care right away. Before you go to a doctor's office, urgent care or emergency department, call ahead and tell them about your recent travel, contact with someone diagnosed with COVID-19, and your symptoms. You should receive instructions from your physician's office regarding next steps of care.  . When you arrive at healthcare provider, tell the healthcare staff immediately you have returned from visiting [place], [place], [place], [place] or [place]; or traveled in the [place] to [place], [place], [place], or [place]; in the last two weeks or you have been in close contact with a person diagnosed with COVID-19 in the last 2 weeks.   . Tell the health care staff about your symptoms: fever, cough and shortness of breath. . After you have been seen by a medical provider, you will be either: o Tested for (COVID-19) and discharged home on quarantine except to seek medical care if symptoms worsen, and asked to  - Stay home and avoid contact with others until you get your results (4-5 days)  - Avoid travel on public transportation if possible (such as bus, train, or airplane) or o Sent to the Emergency Department by EMS for evaluation, COVID-19 testing, and possible  admission depending on your condition and test results.  What to do if you are LOW RISK for COVID-19?  Reduce your risk of any infection by using the same precautions used for avoiding the common cold or flu:  . Wash your hands often with soap and warm water for at least 20 seconds.  If soap and water are not readily available, use an alcohol-based hand sanitizer with at least 60% alcohol.  . If coughing or sneezing, cover your mouth and nose by coughing or sneezing into the elbow areas of your shirt or coat, into a tissue or into your sleeve (not your hands). . Avoid shaking hands with others and consider head nods or verbal greetings only. . Avoid touching your eyes, nose, or mouth with unwashed hands.  . Avoid close contact with people who are sick. . Avoid places or events with large numbers of people in one location, like concerts or sporting events. . Carefully consider travel plans you have or are making. . If you are planning any travel outside or inside the [place], visit the CDC's Travelers' Health webpage for the latest health notices. . If you have some symptoms but not all symptoms, continue to monitor at home and seek medical attention if your symptoms worsen. . If you are having a medical emergency, call 911.   ADDITIONAL HEALTHCARE OPTIONS FOR PATIENTS  [facility] / e-Visit: [email or web address]         [facility]: [phone]  [place]   [facility]: [phone]                   [facility]: [phone]   Spoke with pt denies any sx.  Amy Clontz, CMA 

## 2018-07-27 ENCOUNTER — Ambulatory Visit (INDEPENDENT_AMBULATORY_CARE_PROVIDER_SITE_OTHER): Payer: Managed Care, Other (non HMO) | Admitting: Obstetrics and Gynecology

## 2018-07-27 ENCOUNTER — Other Ambulatory Visit: Payer: Self-pay

## 2018-07-27 VITALS — BP 110/62 | HR 76 | Wt 133.5 lb

## 2018-07-27 DIAGNOSIS — Z3492 Encounter for supervision of normal pregnancy, unspecified, second trimester: Secondary | ICD-10-CM

## 2018-07-27 LAB — POCT URINALYSIS DIPSTICK OB
Bilirubin, UA: NEGATIVE
Blood, UA: NEGATIVE
Glucose, UA: NEGATIVE
Ketones, UA: NEGATIVE
Leukocytes, UA: NEGATIVE
Nitrite, UA: NEGATIVE
POC,PROTEIN,UA: NEGATIVE
Spec Grav, UA: 1.015 (ref 1.010–1.025)
Urobilinogen, UA: 0.2 E.U./dL
pH, UA: 6.5 (ref 5.0–8.0)

## 2018-07-27 NOTE — Progress Notes (Signed)
ROB- pt is doing well 

## 2018-07-27 NOTE — Progress Notes (Signed)
ROB- doing well, will repeat u/s for growth and MCI at next visit with glucola, variable decel noted down to 60s for 20 seconds while listening to [facility], but no more noted while listening for 15 more minutes with good fetal mov't.

## 2018-08-21 ENCOUNTER — Telehealth: Payer: Self-pay

## 2018-08-21 NOTE — Telephone Encounter (Signed)
Spoke with [facility] with [employer] (case # [id number]) prior authorization initiated.  Correct ID # for JK is [id number].

## 2018-08-24 ENCOUNTER — Telehealth: Payer: Self-pay

## 2018-08-24 NOTE — Telephone Encounter (Signed)
Spoke with patient, aware PA was denied for Vitafol gummies.  Advised to take OTC Prenatal gummies per JML.  Patient aware she can use [employer] discount card as well but will still pay over 100 dollars monthly, patient declined.  Patient verbalized understanding.  Viatfol gummies samples left up front, patient will pick up and begin OTC Prenatal gummies after finishing samples.

## 2018-08-28 ENCOUNTER — Ambulatory Visit (INDEPENDENT_AMBULATORY_CARE_PROVIDER_SITE_OTHER): Payer: Managed Care, Other (non HMO)

## 2018-08-28 ENCOUNTER — Ambulatory Visit (INDEPENDENT_AMBULATORY_CARE_PROVIDER_SITE_OTHER): Payer: Managed Care, Other (non HMO) | Admitting: Certified Nurse Midwife

## 2018-08-28 ENCOUNTER — Other Ambulatory Visit: Payer: Managed Care, Other (non HMO)

## 2018-08-28 ENCOUNTER — Other Ambulatory Visit: Payer: Self-pay

## 2018-08-28 VITALS — BP 112/64 | HR 82 | Wt 139.9 lb

## 2018-08-28 DIAGNOSIS — Z3492 Encounter for supervision of normal pregnancy, unspecified, second trimester: Secondary | ICD-10-CM

## 2018-08-28 DIAGNOSIS — Z130 Encounter for screening for diseases of the blood and blood-forming organs and certain disorders involving the immune mechanism: Secondary | ICD-10-CM

## 2018-08-28 DIAGNOSIS — Z131 Encounter for screening for diabetes mellitus: Secondary | ICD-10-CM | POA: Diagnosis not present

## 2018-08-28 DIAGNOSIS — Z113 Encounter for screening for infections with a predominantly sexual mode of transmission: Secondary | ICD-10-CM

## 2018-08-28 DIAGNOSIS — Z3493 Encounter for supervision of normal pregnancy, unspecified, third trimester: Secondary | ICD-10-CM

## 2018-08-28 MED ORDER — TETANUS-DIPHTH-ACELL PERTUSSIS 5-2.5-18.5 LF-MCG/0.5 IM SUSP
0.5000 mL | Freq: Once | INTRAMUSCULAR | Status: AC
  Administered 2018-08-28: 11:00:00 0.5 mL via INTRAMUSCULAR

## 2018-08-28 NOTE — Patient Instructions (Signed)
Pain Relief During Labor and Delivery Many things can cause pain during labor and delivery, including:  Pressure on bones and ligaments due to the baby moving through the pelvis.  Stretching of tissues due to the baby moving through the birth canal.  Muscle tension due to anxiety or nervousness.  The uterus tightening (contracting) and relaxing to help move the baby. There are many ways to deal with the pain of labor and delivery. They include:  Taking prenatal classes. Taking these classes helps you know what to expect during your baby's birth. What you learn will increase your confidence and decrease your anxiety.  Practicing relaxation techniques or doing relaxing activities, such as: ? Focused breathing. ? Meditation. ? Visualization. ? Aroma therapy. ? Listening to your favorite music. ? Hypnosis.  Taking a warm shower or bath (hydrotherapy). This may: ? Provide comfort and relaxation. ? Lessen your perception of pain. ? Decrease the amount of pain medicine needed. ? Decrease the length of labor.  Getting a massage or counterpressure on your back.  Applying warm packs or ice packs.  Changing positions often, moving around, or using a birthing ball.  Getting: ? Pain medicine through an IV or injection into a muscle. ? Pain medicine inserted into your spinal column. ? Injections of sterile water just under the skin on your lower back (intradermal injections). ? Laughing gas (nitrous oxide). Discuss your pain control options with your health care provider during your prenatal visits. Explore the options offered by your hospital or birth center. What kinds of medicine are available? There are two kinds of medicines that can be used to relieve pain during labor and delivery:  Analgesics. These medicines decrease pain without causing you to lose feeling or the ability to move your muscles.  Anesthetics. These medicines block feeling in the body and can decrease your  ability to move freely. Both of these kinds of medicine can cause minor side effects, such as nausea, trouble concentrating, and sleepiness. They can also decrease the baby's heart rate before birth and affect the baby's breathing rate after birth. For this reason, health care providers are careful about when and how much medicine is given. What are specific medicines and procedures that provide pain relief? Local Anesthetics Local anesthetics are used to numb a small area of the body. They may be used along with another kind of anesthetic or used to numb the nerves of the vagina, cervix, and perineum during the second stage of labor. General Anesthetics General anesthetics cause you to lose consciousness so you do not feel pain. They are usually only used for an emergency cesarean delivery. General anesthetics are given through an IV tube and a mask. Pudendal Block A pudendal block is a form of local anesthetic. It may be used to relieve the pain associated with pushing or stretching of the perineum at the time of delivery or to further numb the perineum. A pudendal block is done by injecting numbing medicine through the vaginal wall into a nerve in the pelvis. Epidural Analgesia Epidural analgesia is given through a flexible IV catheter that is inserted into the lower back. Numbing medicine is delivered continuously to the area near your spinal column nerves (epidural space). After having this type of analgesia, you may be able to move your legs but you most likely will not be able to walk. Depending on the amount of medicine given, you may lose all feeling in the lower half of your body, or you may retain some level  of sensation, including the urge to push. Epidural analgesia can be used to provide pain relief for a vaginal birth. Spinal Block A spinal block is similar to epidural analgesia, but the medicine is injected into the spinal fluid instead of the epidural space. A spinal block is only given  once. It starts to relieve pain quickly, but the pain relief lasts only 1-6 hours. Spinal blocks can be used for cesarean deliveries. Combined Spinal-Epidural (CSE) Block A CSE block combines the effects of a spinal block and epidural analgesia. The spinal block works quickly to block all pain. The epidural analgesia provides continuous pain relief, even after the effects of the spinal block have worn off. This information is not intended to replace advice given to you by your health care provider. Make sure you discuss any questions you have with your health care provider. Document Released: 05/05/2008 Document Revised: 12/30/2016 Document Reviewed: 06/10/2015 Elsevier Patient Education  2020 [employer]. Vaginal Birth After Cesarean Delivery  Vaginal birth after cesarean delivery (VBAC) is giving birth vaginally after previously delivering a baby through a cesarean section (C-section). A VBAC may be a safe option for you, depending on your health and other factors. It is important to discuss VBAC with your health care provider early in your pregnancy so you can understand the risks, benefits, and options. Having these discussions early will give you time to make your birth plan. Who are the best candidates for VBAC? The best candidates for VBAC are women who:  Have had one or two prior cesarean deliveries, and the incision made during the delivery was horizontal (low transverse).  Do not have a vertical (classical) scar on their uterus.  Have not had a tear in the wall of their uterus (uterine rupture).  Plan to have more pregnancies. A VBAC is also more likely to be successful:  In women who have previously given birth vaginally.  When labor starts by itself (spontaneously) before the due date. What are the benefits of VBAC? The benefits of delivering your baby vaginally instead of by a cesarean delivery include:  A shorter hospital stay.  A faster recovery time.  Less pain.   Avoiding risks associated with major surgery, such as infection and blood clots.  Less blood loss and less need for donated blood (transfusions). What are the risks of VBAC? The main risk of attempting a VBAC is that it may fail, forcing your health care provider to deliver your baby by a C-section. Other risks are rare and include:  Tearing (rupture) of the scar from a past cesarean delivery.  Other risks associated with vaginal deliveries. If a repeat cesarean delivery is needed, the risks include:  Blood loss.  Infection.  Blood clot.  Damage to surrounding organs.  Removal of the uterus (hysterectomy), if it is damaged.  Placenta problems in future pregnancies. What else should I know about my options? Delivering a baby through a VBAC is similar to having a normal spontaneous vaginal delivery. Therefore, it is safe:  To try with twins.  For your health care provider to try to turn the baby from a breech position (external cephalic version) during labor.  With epidural analgesia for pain relief. Consider where you would like to deliver your baby. VBAC should be attempted in facilities where an emergency cesarean delivery can be performed. VBAC is not recommended for home births. Any changes in your health or your baby's health during your pregnancy may make it necessary to change your initial decision about  VBAC. Your health care provider may recommend that you do not attempt a VBAC if:  Your baby's suspected weight is 8.8 lb (4 kg) or more.  You have preeclampsia. This is a condition that causes high blood pressure along with other symptoms, such as swelling and headaches.  You will have VBAC less than 19 months after your cesarean delivery.  You are past your due date.  You need to have labor started (induced) because your cervix is not ready for labor (unfavorable). Where to find more information  American Pregnancy Association: americanpregnancy.org  [employer] of Obstetricians and Gynecologists: acog.org Summary  Vaginal birth after cesarean delivery (VBAC) is giving birth vaginally after previously delivering a baby through a cesarean section (C-section). A VBAC may be a safe option for you, depending on your health and other factors.  Discuss VBAC with your health care provider early in your pregnancy so you can understand the risks, benefits, options, and have plenty of time to make your birth plan.  The main risk of attempting a VBAC is that it may fail, forcing your health care provider to deliver your baby by a C-section. Other risks are rare. This information is not intended to replace advice given to you by your health care provider. Make sure you discuss any questions you have with your health care provider. Document Released: 07/10/2006 Document Revised: 05/15/2018 Document Reviewed: 04/26/2016 Elsevier Patient Education  [place]. Preparing for Vaginal Birth After Cesarean Delivery Vaginal birth after cesarean delivery (VBAC) is giving birth vaginally after previously delivering a baby through a cesarean section (C-section). You and your health are provider will discuss your options and whether you may be a good candidate for VBAC. What are my options? After a cesarean delivery, your options for future deliveries may include:  Scheduled repeat cesarean delivery. This is done in a hospital with an operating room.  Trial of labor after cesarean (TOLAC). A successful TOLAC results in a vaginal delivery. If it is not successful, you will need to have a cesarean delivery. TOLAC should be attempted in facilities where an emergency cesarean delivery can be performed. It should not be done as a home birth. Talk with your health care provider about the risks and benefits of each option early in your pregnancy. The best option for you will depend on your preferences and your overall health as well as your baby's. What should I know  about my past cesarean delivery? It is important to know what type of incision was made in your uterus in a past cesarean delivery. The type of incision can affect the success of your TOLAC. Types of incisions include:  Low transverse. This is a side-to-side cut low on your uterus. The scar on your skin looks like a horizontal line just above your pubic area. This type of cut is the most common and makes you a good candidate for TOLAC.  Low vertical. This is an up-and-down cut low on your uterus. The scar on your skin looks like a vertical line between your pubic area and belly button. This type of cut puts you at higher risk for problems during TOLAC.  High vertical or classical. This is an up-and-down cut high on your uterus. The scar on your skin looks like a vertical line that runs over the top of your belly button. This type of cut has the highest risk for problems and usually means that TOLAC is not an option. When is VBAC not an option? As  you progress through your pregnancy, circumstances may change and you may need to reconsider your options. Your situation may also change even as you begin TOLAC. Your health care provider may not want you to attempt a VBAC if you:  Need to have labor started (induced) because your cervix is not ready for labor.  Have never had a vaginal delivery.  Have had more than two cesarean deliveries.  Are overdue.  Are pregnant with a very large baby.  Have a condition that causes high blood pressure (preeclampsia). Questions to ask your health care provider  Am I a good candidate for TOLAC?  What are my chances of a successful vaginal delivery?  Is my preferred birth location equipped for a TOLAC?  What are my pain management options during a TOLAC? Where to find more information  American Congress of Obstetricians and Gynecologists: www.acog.[place]: www.midwife.org Summary  Vaginal birth after cesarean delivery  (VBAC) is giving birth vaginally after previously delivering a baby through a cesarean section (C-section).  VBAC may be a safe and appropriate option for you depending on your medical history and other risk factors. Talk with your health care provider about the options available to you, and the risks and benefits of each early in your pregnancy.  TOLAC should be attempted in facilities where emergency cesarean section procedures can be performed. This information is not intended to replace advice given to you by your health care provider. Make sure you discuss any questions you have with your health care provider. Document Released: 10/05/2010 Document Revised: 05/15/2018 Document Reviewed: 04/28/2016 Elsevier Patient Education  [place]. Common Medications Safe in Pregnancy  Acne:      Constipation:  Benzoyl Peroxide     Colace  Clindamycin      Dulcolax Suppository  Topica Erythromycin     Fibercon  Salicylic Acid      Metamucil         Miralax AVOID:        Senakot   Accutane    Cough:  Retin-A       Cough Drops  Tetracycline      Phenergan w/ Codeine if Rx  Minocycline      Robitussin (Plain & DM)  Antibiotics:     Crabs/Lice:  Ceclor       RID  Cephalosporins    AVOID:  E-Mycins      Kwell  Keflex  Macrobid/Macrodantin   Diarrhea:  Penicillin      Kao-Pectate  Zithromax      Imodium AD         PUSH FLUIDS AVOID:       Cipro     Fever:  Tetracycline      Tylenol (Regular or Extra  Minocycline       Strength)  Levaquin      Extra Strength-Do not          Exceed 8 tabs/24 hrs Caffeine:        <284m/day (equiv. To 1 cup of coffee or  approx. 3 12 oz sodas)         Gas: Cold/Hayfever:       Gas-X  Benadryl      Mylicon  Claritin       Phazyme  **Claritin-D        Chlor-Trimeton    Headaches:  Dimetapp      ASA-Free Excedrin  Drixoral-Non-Drowsy     Cold Compress  Mucinex (Guaifenasin)  Tylenol (Regular or Extra  Sudafed/Sudafed-12 Hour     Strength)   **Sudafed PE Pseudoephedrine   Tylenol Cold & Sinus     Vicks Vapor Rub  Zyrtec  **AVOID if Problems With Blood Pressure         Heartburn: Avoid lying down for at least 1 hour after meals  Aciphex      Maalox     Rash:  Milk of Magnesia     Benadryl    Mylanta       1% Hydrocortisone Cream  Pepcid  Pepcid Complete   Sleep Aids:  Prevacid      Ambien   Prilosec       Benadryl  Rolaids       Chamomile Tea  Tums (Limit 4/day)     Unisom  Zantac       Tylenol PM         Warm milk-add vanilla or  Hemorrhoids:       Sugar for taste  Anusol/Anusol H.C.  (RX: Analapram 2.5%)  Sugar Substitutes:  Hydrocortisone OTC     Ok in moderation  Preparation H      Tucks        Vaseline lotion applied to tissue with wiping    Herpes:     Throat:  Acyclovir      Oragel  Famvir  Valtrex     Vaccines:         Flu Shot Leg Cramps:       *Gardasil  Benadryl      Hepatitis A         Hepatitis B Nasal Spray:       Pneumovax  Saline Nasal Spray     Polio Booster         Tetanus Nausea:       Tuberculosis test or PPD  Vitamin B6 25 mg TID   AVOID:    Dramamine      *Gardasil  Emetrol       Live Poliovirus  Ginger Root 250 mg QID    MMR (measles, mumps &  High Complex Carbs @ Bedtime    rebella)  Sea Bands-Accupressure    Varicella (Chickenpox)  Unisom 1/2 tab TID     *No known complications           If received before Pain:         Known pregnancy;   Darvocet       Resume series after  Lortab        Delivery  Percocet    Yeast:   Tramadol      Femstat  Tylenol 3      Gyne-lotrimin  Ultram       Monistat  Vicodin           MISC:         All Sunscreens           Hair Coloring/highlights          Insect Repellant's          (Including DEET)         Mystic Tans Third Trimester of Pregnancy  The third trimester is from week 28 through week 40 (months 7 through 9). This trimester is when your unborn baby (fetus) is growing very fast. At the end of the ninth month, the unborn  baby is about 20 inches in length. It weighs about 6-10 pounds. Follow these instructions at home: Medicines  Take over-the-counter and prescription  medicines only as told by your doctor. Some medicines are safe and some medicines are not safe during pregnancy.  Take a prenatal vitamin that contains at least 600 micrograms (mcg) of folic acid.  If you have trouble pooping (constipation), take medicine that will make your stool soft (stool softener) if your doctor approves. Eating and drinking   Eat regular, healthy meals.  Avoid raw meat and uncooked cheese.  If you get low calcium from the food you eat, talk to your doctor about taking a daily calcium supplement.  Eat four or five small meals rather than three large meals a day.  Avoid foods that are high in fat and sugars, such as fried and sweet foods.  To prevent constipation: ? Eat foods that are high in fiber, like fresh fruits and vegetables, whole grains, and beans. ? Drink enough fluids to keep your pee (urine) clear or pale yellow. Activity  Exercise only as told by your doctor. Stop exercising if you start to have cramps.  Avoid heavy lifting, wear low heels, and sit up straight.  Do not exercise if it is too hot, too humid, or if you are in a place of great height (high altitude).  You may continue to have sex unless your doctor tells you not to. Relieving pain and discomfort  Wear a good support bra if your breasts are tender.  Take frequent breaks and rest with your legs raised if you have leg cramps or low back pain.  Take warm water baths (sitz baths) to soothe pain or discomfort caused by hemorrhoids. Use hemorrhoid cream if your doctor approves.  If you develop puffy, bulging veins (varicose veins) in your legs: ? Wear support hose or compression stockings as told by your doctor. ? Raise (elevate) your feet for 15 minutes, 3-4 times a day. ? Limit salt in your food. Safety  Wear your seat belt when  driving.  Make a list of emergency phone numbers, including numbers for family, friends, the hospital, and police and fire departments. Preparing for your baby's arrival To prepare for the arrival of your baby:  Take prenatal classes.  Practice driving to the hospital.  Visit the hospital and tour the maternity area.  Talk to your work about taking leave once the baby comes.  Pack your hospital bag.  Prepare the baby's room.  Go to your doctor visits.  Buy a rear-facing car seat. Learn how to install it in your car. General instructions  Do not use hot tubs, steam rooms, or saunas.  Do not use any products that contain nicotine or tobacco, such as cigarettes and e-cigarettes. If you need help quitting, ask your doctor.  Do not drink alcohol.  Do not douche or use tampons or scented sanitary pads.  Do not cross your legs for long periods of time.  Do not travel for long distances unless you must. Only do so if your doctor says it is okay.  Visit your dentist if you have not gone during your pregnancy. Use a soft toothbrush to brush your teeth. Be gentle when you floss.  Avoid cat litter boxes and soil used by cats. These carry germs that can cause birth defects in the baby and can cause a loss of your baby (miscarriage) or stillbirth.  Keep all your prenatal visits as told by your doctor. This is important. Contact a doctor if:  You are not sure if you are in labor or if your water has broken.  You are dizzy.  You have mild cramps or pressure in your lower belly.  You have a nagging pain in your belly area.  You continue to feel sick to your stomach, you throw up, or you have watery poop.  You have bad smelling fluid coming from your vagina.  You have pain when you pee. Get help right away if:  You have a fever.  You are leaking fluid from your vagina.  You are spotting or bleeding from your vagina.  You have severe belly cramps or pain.  You lose or  gain weight quickly.  You have trouble catching your breath and have chest pain.  You notice sudden or extreme puffiness (swelling) of your face, hands, ankles, feet, or legs.  You have not felt the baby move in over an hour.  You have severe headaches that do not go away with medicine.  You have trouble seeing.  You are leaking, or you are having a gush of fluid, from your vagina before you are 37 weeks.  You have regular belly spasms (contractions) before you are 37 weeks. Summary  The third trimester is from week 28 through week 40 (months 7 through 9). This time is when your unborn baby is growing very fast.  Follow your doctor's advice about medicine, food, and activity.  Get ready for the arrival of your baby by taking prenatal classes, getting all the baby items ready, preparing the baby's room, and visiting your doctor to be checked.  Get help right away if you are bleeding from your vagina, or you have chest pain and trouble catching your breath, or if you have not felt your baby move in over an hour. This information is not intended to replace advice given to you by your health care provider. Make sure you discuss any questions you have with your health care provider. Document Released: 04/13/2009 Document Revised: 05/10/2018 Document Reviewed: 02/23/2016 Elsevier Patient Education  2020 [employer].

## 2018-08-28 NOTE — Progress Notes (Signed)
ROB-No complaints.  

## 2018-08-28 NOTE — Progress Notes (Signed)
ROB-Doing well, no questions or concerns. Desires epidural, plans breastfeeding. Undecided about circumcision, uses [facility]. Growth ultrasound today [id number]%, see below. Third trimester handouts provided. Anticipatory guidance regarding course of prenatal care. TDaP Given. Blood transfusion consent reviewed and signed. Reviewed red flag symptoms and when to call. RTC x 2 weeks for labs and ROB or sooner if needed.   ULTRASOUND REPORT  Location: [facility] Date of Service: 08/28/2018   Indications:growth/afi Findings:  Tammie Burke intrauterine pregnancy is visualized with FHR at 141 BPM. Biometrics give an (U/S) Gestational age of [redacted]w[redacted]d and an (U/S) EDD of 11/19/2018; this correlates with the clinically established Estimated Date of Delivery: 11/14/18.  Fetal presentation is Variable.  Placenta: posterior. Grade: 1 AFI: 12.3 cm  Growth percentile is 33. EFW: 1105 g( 2 lbs 7 oz)   Impression: 1. 103w6d Viable Singleton Intrauterine pregnancy previously established criteria. 2. Growth is 33 %ile.  AFI is 12.3 cm.   Recommendations: 1.Clinical correlation with the patient's History and Physical Exam.

## 2018-09-12 ENCOUNTER — Encounter: Payer: Self-pay | Admitting: Certified Nurse Midwife

## 2018-09-12 ENCOUNTER — Other Ambulatory Visit: Payer: Self-pay

## 2018-09-12 ENCOUNTER — Ambulatory Visit (INDEPENDENT_AMBULATORY_CARE_PROVIDER_SITE_OTHER): Payer: Managed Care, Other (non HMO) | Admitting: Certified Nurse Midwife

## 2018-09-12 ENCOUNTER — Other Ambulatory Visit: Payer: Managed Care, Other (non HMO)

## 2018-09-12 VITALS — BP 106/57 | HR 75 | Wt 138.2 lb

## 2018-09-12 DIAGNOSIS — Z3493 Encounter for supervision of normal pregnancy, unspecified, third trimester: Secondary | ICD-10-CM

## 2018-09-12 LAB — POCT URINALYSIS DIPSTICK OB
Bilirubin, UA: NEGATIVE
Blood, UA: NEGATIVE
Glucose, UA: NEGATIVE
Ketones, UA: NEGATIVE
Leukocytes, UA: NEGATIVE
Nitrite, UA: NEGATIVE
POC,PROTEIN,UA: NEGATIVE
Spec Grav, UA: 1.01 (ref 1.010–1.025)
Urobilinogen, UA: 0.2 E.U./dL
pH, UA: 7 (ref 5.0–8.0)

## 2018-09-12 MED ORDER — TETANUS-DIPHTH-ACELL PERTUSSIS 5-2.5-18.5 LF-MCG/0.5 IM SUSP: 0.5000 mL | Freq: Once | INTRAMUSCULAR | Status: DC

## 2018-09-12 NOTE — Patient Instructions (Signed)
Preparing for Vaginal Birth After Cesarean Delivery Vaginal birth after cesarean delivery (VBAC) is giving birth vaginally after previously delivering a baby through a cesarean section (C-section). You and your health are provider will discuss your options and whether you may be a good candidate for VBAC. What are my options? After a cesarean delivery, your options for future deliveries may include:  Scheduled repeat cesarean delivery. This is done in a hospital with an operating room.  Trial of labor after cesarean (TOLAC). A successful TOLAC results in a vaginal delivery. If it is not successful, you will need to have a cesarean delivery. TOLAC should be attempted in facilities where an emergency cesarean delivery can be performed. It should not be done as a home birth. Talk with your health care provider about the risks and benefits of each option early in your pregnancy. The best option for you will depend on your preferences and your overall health as well as your baby's. What should I know about my past cesarean delivery? It is important to know what type of incision was made in your uterus in a past cesarean delivery. The type of incision can affect the success of your TOLAC. Types of incisions include:  Low transverse. This is a side-to-side cut low on your uterus. The scar on your skin looks like a horizontal line just above your pubic area. This type of cut is the most common and makes you a good candidate for TOLAC.  Low vertical. This is an up-and-down cut low on your uterus. The scar on your skin looks like a vertical line between your pubic area and belly button. This type of cut puts you at higher risk for problems during TOLAC.  High vertical or classical. This is an up-and-down cut high on your uterus. The scar on your skin looks like a vertical line that runs over the top of your belly button. This type of cut has the highest risk for problems and usually means that TOLAC is not an  option. When is VBAC not an option? As you progress through your pregnancy, circumstances may change and you may need to reconsider your options. Your situation may also change even as you begin TOLAC. Your health care provider may not want you to attempt a VBAC if you:  Need to have labor started (induced) because your cervix is not ready for labor.  Have never had a vaginal delivery.  Have had more than two cesarean deliveries.  Are overdue.  Are pregnant with a very large baby.  Have a condition that causes high blood pressure (preeclampsia). Questions to ask your health care provider  Am I a good candidate for TOLAC?  What are my chances of a successful vaginal delivery?  Is my preferred birth location equipped for a TOLAC?  What are my pain management options during a TOLAC? Where to find more information  American Congress of Obstetricians and Gynecologists: www.acog.org  American College of Nurse-Midwives: www.midwife.org Summary  Vaginal birth after cesarean delivery (VBAC) is giving birth vaginally after previously delivering a baby through a cesarean section (C-section).  VBAC may be a safe and appropriate option for you depending on your medical history and other risk factors. Talk with your health care provider about the options available to you, and the risks and benefits of each early in your pregnancy.  TOLAC should be attempted in facilities where emergency cesarean section procedures can be performed. This information is not intended to replace advice given to you by   your health care provider. Make sure you discuss any questions you have with your health care provider. Document Released: 10/05/2010 Document Revised: 05/15/2018 Document Reviewed: 04/28/2016 Elsevier Patient Education  2020 Elsevier Inc.  

## 2018-09-12 NOTE — Progress Notes (Signed)
ROB doing well. Glucose screen today. Discussed follow up with MD for [facility] counseling. She verbalizes and agrees to plan. Follow up 2 wks.   Philip Aspen, CNM

## 2018-09-13 LAB — RPR: RPR Ser Ql: NONREACTIVE

## 2018-09-13 LAB — CBC
Hematocrit: 33.1 % — ABNORMAL LOW (ref 34.0–46.6)
Hemoglobin: 10.8 g/dL — ABNORMAL LOW (ref 11.1–15.9)
MCH: 26.9 pg (ref 26.6–33.0)
MCHC: 32.6 g/dL (ref 31.5–35.7)
MCV: 82 fL (ref 79–97)
Platelets: 143 10*3/uL — ABNORMAL LOW (ref 150–450)
RBC: 4.02 x10E6/uL (ref 3.77–5.28)
RDW: 13 % (ref 11.7–15.4)
WBC: 12.4 10*3/uL — ABNORMAL HIGH (ref 3.4–10.8)

## 2018-09-13 LAB — GLUCOSE, 1 HOUR GESTATIONAL: Gestational Diabetes Screen: 124 mg/dL (ref 65–139)

## 2018-09-14 ENCOUNTER — Other Ambulatory Visit: Payer: Self-pay | Admitting: Certified Nurse Midwife

## 2018-09-14 ENCOUNTER — Encounter: Payer: Self-pay | Admitting: Family Medicine

## 2018-09-14 MED ORDER — FUSION PLUS PO CAPS: 1.0000 | ORAL_CAPSULE | Freq: Every day | ORAL | 6 refills | Status: DC

## 2018-09-25 ENCOUNTER — Telehealth: Payer: Self-pay

## 2018-09-25 NOTE — Telephone Encounter (Signed)
Coronavirus (COVID-19) Are you at risk?  Are you at risk for the Coronavirus (COVID-19)?  To be considered HIGH RISK for Coronavirus (COVID-19), you have to meet the following criteria:  . Traveled to [place], [place], [place], [place] or [place]; or in the [place] to [place], [place], [place], or [place]; and have fever, cough, and shortness of breath within the last 2 weeks of travel OR . Been in close contact with a person diagnosed with COVID-19 within the last 2 weeks and have fever, cough, and shortness of breath . IF YOU DO NOT MEET THESE CRITERIA, YOU ARE CONSIDERED LOW RISK FOR COVID-19.  What to do if you are HIGH RISK for COVID-19?  . If you are having a medical emergency, call 911. . Seek medical care right away. Before you go to a doctor's office, urgent care or emergency department, call ahead and tell them about your recent travel, contact with someone diagnosed with COVID-19, and your symptoms. You should receive instructions from your physician's office regarding next steps of care.  . When you arrive at healthcare provider, tell the healthcare staff immediately you have returned from visiting [place], [place], [place], [place] or [place]; or traveled in the [place] to [place], [place], [place], or [place]; in the last two weeks or you have been in close contact with a person diagnosed with COVID-19 in the last 2 weeks.   . Tell the health care staff about your symptoms: fever, cough and shortness of breath. . After you have been seen by a medical provider, you will be either: o Tested for (COVID-19) and discharged home on quarantine except to seek medical care if symptoms worsen, and asked to  - Stay home and avoid contact with others until you get your results (4-5 days)  - Avoid travel on public transportation if possible (such as bus, train, or airplane) or o Sent to the Emergency Department by EMS for evaluation, COVID-19 testing, and possible  admission depending on your condition and test results.  What to do if you are LOW RISK for COVID-19?  Reduce your risk of any infection by using the same precautions used for avoiding the common cold or flu:  . Wash your hands often with soap and warm water for at least 20 seconds.  If soap and water are not readily available, use an alcohol-based hand sanitizer with at least 60% alcohol.  . If coughing or sneezing, cover your mouth and nose by coughing or sneezing into the elbow areas of your shirt or coat, into a tissue or into your sleeve (not your hands). . Avoid shaking hands with others and consider head nods or verbal greetings only. . Avoid touching your eyes, nose, or mouth with unwashed hands.  . Avoid close contact with people who are Chandria Rookstool. . Avoid places or events with large numbers of people in one location, like concerts or sporting events. . Carefully consider travel plans you have or are making. . If you are planning any travel outside or inside the [place], visit the CDC's Travelers' Health webpage for the latest health notices. . If you have some symptoms but not all symptoms, continue to monitor at home and seek medical attention if your symptoms worsen. . If you are having a medical emergency, call 911.  09/25/18 SCREENING NEG SLS ADDITIONAL HEALTHCARE OPTIONS FOR PATIENTS  [facility] / e-Visit: [email or web address]         [facility]: [phone]    [facility]: [phone]                   [facility]: [phone]  

## 2018-09-26 ENCOUNTER — Encounter: Payer: Self-pay | Admitting: Obstetrics and Gynecology

## 2018-09-26 ENCOUNTER — Ambulatory Visit (INDEPENDENT_AMBULATORY_CARE_PROVIDER_SITE_OTHER): Payer: Managed Care, Other (non HMO) | Admitting: Obstetrics and Gynecology

## 2018-09-26 ENCOUNTER — Other Ambulatory Visit: Payer: Self-pay

## 2018-09-26 VITALS — BP 111/72 | HR 88 | Wt 142.3 lb

## 2018-09-26 DIAGNOSIS — Z8759 Personal history of other complications of pregnancy, childbirth and the puerperium: Secondary | ICD-10-CM

## 2018-09-26 DIAGNOSIS — Z8751 Personal history of pre-term labor: Secondary | ICD-10-CM

## 2018-09-26 DIAGNOSIS — O0993 Supervision of high risk pregnancy, unspecified, third trimester: Secondary | ICD-10-CM

## 2018-09-26 DIAGNOSIS — O34219 Maternal care for unspecified type scar from previous cesarean delivery: Secondary | ICD-10-CM

## 2018-09-26 DIAGNOSIS — O99013 Anemia complicating pregnancy, third trimester: Secondary | ICD-10-CM

## 2018-09-26 DIAGNOSIS — Z98891 History of uterine scar from previous surgery: Secondary | ICD-10-CM

## 2018-09-26 LAB — POCT URINALYSIS DIPSTICK OB
Bilirubin, UA: NEGATIVE
Blood, UA: NEGATIVE
Glucose, UA: NEGATIVE
Ketones, UA: NEGATIVE
Nitrite, UA: NEGATIVE
POC,PROTEIN,UA: NEGATIVE
Spec Grav, UA: 1.01 (ref 1.010–1.025)
Urobilinogen, UA: 0.2 E.U./dL
pH, UA: 7.5 (ref 5.0–8.0)

## 2018-09-26 NOTE — Progress Notes (Signed)
ROB Consult: Patient presents from midwifery care for consultation for TOLAC vs repeat C-section.  She is doing well, no complaints. Reviewed labs, normal glucola, mild anemia, has not picked up iron pills yet from pharmacy.   Patien is a [id number] [redacted]w[redacted]d with Estimated Date of Delivery: 11/14/18 with a previous history of preterm birth at 16 weeks, with C-section due to arrest of descent and non-reassuring fetal tracing after ~ 3 hrs of pushing (review of record reports that was noted to arrest at +2 station, did not note the use of any outlet instruments such as vacuum/forceps.  Infant was 5 lb 1 oz, undocumented positioning of the fetus at time of C-section.  Fetal tracing was noted to be Category II during last hour of pushing with deep variable decelerations, with small placental abruption noted at C-section).   The following risks were discussed with the patient.  Risk of uterine rupture at term is 0.78 percent with TOLAC and 0.22 percent with ERCD. 1 in 10 uterine ruptures will result in neonatal death or neurological injury. The benefits of a trial of labor after cesarean (TOLAC) resulting in a vaginal birth after cesarean (VBAC) include the following: shorter length of hospital stay and postpartum recovery (in most cases); fewer complications, such as postpartum fever, wound or uterine infection, thromboembolism (blood clots in the leg or lung), need for blood transfusion and fewer neonatal breathing problems. The risks of an attempted VBAC or TOLAC include the following: . Risk of failed trial of labor after cesarean (TOLAC) without a vaginal birth after cesarean (VBAC) resulting in repeat cesarean delivery (RCD) in about 20 to 4 percent of women who attempt VBAC.  Marland Kitchen Risk of rupture of uterus resulting in an emergency cesarean delivery. The risk of uterine rupture may be related in part to the type of uterine incision made during the first cesarean delivery. A previous transverse uterine incision  has the lowest risk of rupture (0.2 to 1.5 percent risk). Vertical or T-shaped uterine incisions have a higher risk of uterine rupture (4 to 9 percent risk)The risk of fetal death is very low with both VBAC and elective repeat cesarean delivery (ERCD), but the likelihood of fetal death is higher with VBAC than with ERCD. Maternal death is very rare with either type of delivery. The risks of an elective repeat cesarean delivery (ERCD) were reviewed with the patient including but not limited to: 03/998 risk of uterine rupture which could have serious consequences, bleeding which may require transfusion; infection which may require antibiotics; injury to bowel, bladder or other surrounding organs (bowel, bladder, ureters); injury to the fetus; need for additional procedures including hysterectomy in the event of a life-threatening hemorrhage; thromboembolic phenomenon; abnormal placentation; incisional problems; death and other postoperative or anesthesia complications.    VBAC score calculated was 49.2%.  Based on patient's score as well as events surrounding her delivery, as well as the fact that this baby will be larger if she carries to term, it would be recommended for repeat C-section, however patient declines.  Discussed that this was the patient's choice, and so she will be allowed a TOLAC trial.   Will continue routine postpartum care.   Rubie Maid, MD [facility]

## 2018-09-26 NOTE — Progress Notes (Signed)
ROB-Pt is present for prenatal care. Pt stated that she was doing well. Pt is currently not taking her iron pills due to not knowing about the medication. Pt was encouraged to go to the pharmacy and pick up the medication.

## 2018-10-07 IMAGING — CR DG CHEST 1V
1 series · 1 of 1 positions shown · non-contrast
Comparison: 02/04/2017

CLINICAL DATA: Left lower lobe nodule versus nipple shadow

EXAM:
CHEST 1 VIEW

[w chest pa]
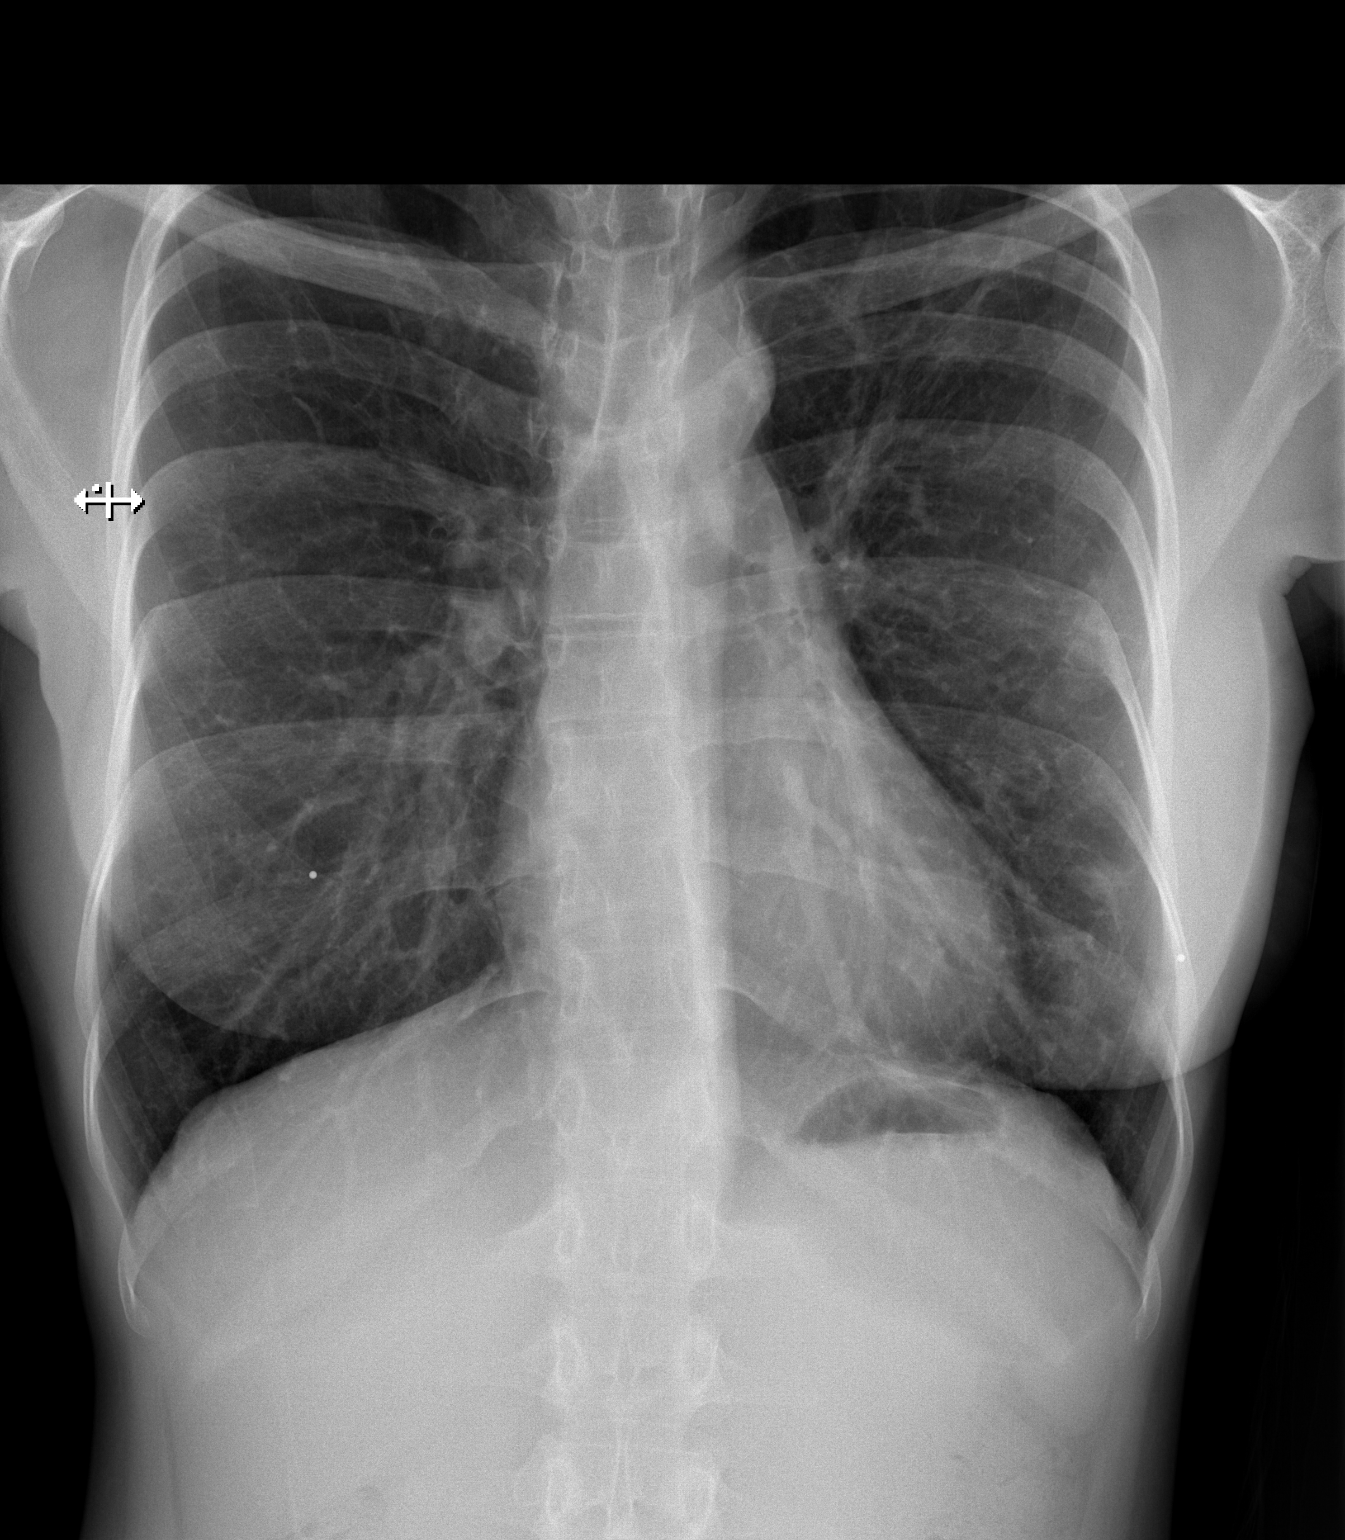

[1 of 1 positions shown; findings below may reference images not displayed]

FINDINGS: Exam performed with nipple markers. Left lower lung nodular opacity
previously described does not definitively correlate with the nipple
marker. Lung nodule not excluded. Recommend further evaluation with
nonemergent chest CT. Stable mild hyperinflation and left upper lobe
perihilar parenchymal scarring. No superimposed pneumonia, collapse
or consolidation. No edema, effusion or pneumothorax. Trachea is
midline. Normal heart size and vascularity.
IMPRESSION: Left lower lung nodular opacity as above. Recommend follow-up with
chest CT.

Left upper lobe perihilar parenchymal scarring

No superimposed acute airspace process or pneumonia

## 2018-10-11 ENCOUNTER — Ambulatory Visit (INDEPENDENT_AMBULATORY_CARE_PROVIDER_SITE_OTHER): Payer: Managed Care, Other (non HMO) | Admitting: Certified Nurse Midwife

## 2018-10-11 ENCOUNTER — Other Ambulatory Visit: Payer: Self-pay

## 2018-10-11 VITALS — BP 97/55 | HR 80 | Wt 146.0 lb

## 2018-10-11 DIAGNOSIS — O0993 Supervision of high risk pregnancy, unspecified, third trimester: Secondary | ICD-10-CM | POA: Diagnosis not present

## 2018-10-11 DIAGNOSIS — Z8759 Personal history of other complications of pregnancy, childbirth and the puerperium: Secondary | ICD-10-CM

## 2018-10-11 DIAGNOSIS — O99013 Anemia complicating pregnancy, third trimester: Secondary | ICD-10-CM

## 2018-10-11 DIAGNOSIS — O34219 Maternal care for unspecified type scar from previous cesarean delivery: Secondary | ICD-10-CM

## 2018-10-11 DIAGNOSIS — Z8751 Personal history of pre-term labor: Secondary | ICD-10-CM

## 2018-10-11 LAB — POCT URINALYSIS DIPSTICK OB
Bilirubin, UA: NEGATIVE
Blood, UA: NEGATIVE
Glucose, UA: NEGATIVE
Ketones, UA: NEGATIVE
Leukocytes, UA: NEGATIVE
Nitrite, UA: NEGATIVE
POC,PROTEIN,UA: NEGATIVE
Spec Grav, UA: 1.01 (ref 1.010–1.025)
Urobilinogen, UA: 0.2 E.U./dL
pH, UA: 5 (ref 5.0–8.0)

## 2018-10-11 NOTE — Patient Instructions (Signed)
Vaginal Birth After Cesarean Delivery  Vaginal birth after cesarean delivery (VBAC) is giving birth vaginally after previously delivering a baby through a cesarean section (C-section). A VBAC may be a safe option for you, depending on your health and other factors. It is important to discuss VBAC with your health care provider early in your pregnancy so you can understand the risks, benefits, and options. Having these discussions early will give you time to make your birth plan. Who are the best candidates for VBAC? The best candidates for VBAC are women who:  Have had one or two prior cesarean deliveries, and the incision made during the delivery was horizontal (low transverse).  Do not have a vertical (classical) scar on their uterus.  Have not had a tear in the wall of their uterus (uterine rupture).  Plan to have more pregnancies. A VBAC is also more likely to be successful:  In women who have previously given birth vaginally.  When labor starts by itself (spontaneously) before the due date. What are the benefits of VBAC? The benefits of delivering your baby vaginally instead of by a cesarean delivery include:  A shorter hospital stay.  A faster recovery time.  Less pain.  Avoiding risks associated with major surgery, such as infection and blood clots.  Less blood loss and less need for donated blood (transfusions). What are the risks of VBAC? The main risk of attempting a VBAC is that it may fail, forcing your health care provider to deliver your baby by a C-section. Other risks are rare and include:  Tearing (rupture) of the scar from a past cesarean delivery.  Other risks associated with vaginal deliveries. If a repeat cesarean delivery is needed, the risks include:  Blood loss.  Infection.  Blood clot.  Damage to surrounding organs.  Removal of the uterus (hysterectomy), if it is damaged.  Placenta problems in future pregnancies. What else should I know  about my options? Delivering a baby through a VBAC is similar to having a normal spontaneous vaginal delivery. Therefore, it is safe:  To try with twins.  For your health care provider to try to turn the baby from a breech position (external cephalic version) during labor.  With epidural analgesia for pain relief. Consider where you would like to deliver your baby. VBAC should be attempted in facilities where an emergency cesarean delivery can be performed. VBAC is not recommended for home births. Any changes in your health or your baby's health during your pregnancy may make it necessary to change your initial decision about VBAC. Your health care provider may recommend that you do not attempt a VBAC if:  Your baby's suspected weight is 8.8 lb (4 kg) or more.  You have preeclampsia. This is a condition that causes high blood pressure along with other symptoms, such as swelling and headaches.  You will have VBAC less than 19 months after your cesarean delivery.  You are past your due date.  You need to have labor started (induced) because your cervix is not ready for labor (unfavorable). Where to find more information  American Pregnancy Association: americanpregnancy.org  American Congress of Obstetricians and Gynecologists: acog.org Summary  Vaginal birth after cesarean delivery (VBAC) is giving birth vaginally after previously delivering a baby through a cesarean section (C-section). A VBAC may be a safe option for you, depending on your health and other factors.  Discuss VBAC with your health care provider early in your pregnancy so you can understand the risks, benefits, options, and   have plenty of time to make your birth plan.  The main risk of attempting a VBAC is that it may fail, forcing your health care provider to deliver your baby by a C-section. Other risks are rare. This information is not intended to replace advice given to you by your health care provider. Make sure  you discuss any questions you have with your health care provider. Document Released: 07/10/2006 Document Revised: 05/15/2018 Document Reviewed: 04/26/2016 Elsevier Patient Education  2020 [employer] Preparing for Vaginal Birth After Cesarean Delivery Vaginal birth after cesarean delivery (VBAC) is giving birth vaginally after previously delivering a baby through a cesarean section (C-section). You and your health are provider will discuss your options and whether you may be a good candidate for VBAC. What are my options? After a cesarean delivery, your options for future deliveries may include:  Scheduled repeat cesarean delivery. This is done in a hospital with an operating room.  Trial of labor after cesarean (TOLAC). A successful TOLAC results in a vaginal delivery. If it is not successful, you will need to have a cesarean delivery. TOLAC should be attempted in facilities where an emergency cesarean delivery can be performed. It should not be done as a home birth. Talk with your health care provider about the risks and benefits of each option early in your pregnancy. The best option for you will depend on your preferences and your overall health as well as your baby's. What should I know about my past cesarean delivery? It is important to know what type of incision was made in your uterus in a past cesarean delivery. The type of incision can affect the success of your TOLAC. Types of incisions include:  Low transverse. This is a side-to-side cut low on your uterus. The scar on your skin looks like a horizontal line just above your pubic area. This type of cut is the most common and makes you a good candidate for TOLAC.  Low vertical. This is an up-and-down cut low on your uterus. The scar on your skin looks like a vertical line between your pubic area and belly button. This type of cut puts you at higher risk for problems during TOLAC.  High vertical or classical. This is an up-and-down  cut high on your uterus. The scar on your skin looks like a vertical line that runs over the top of your belly button. This type of cut has the highest risk for problems and usually means that TOLAC is not an option. When is VBAC not an option? As you progress through your pregnancy, circumstances may change and you may need to reconsider your options. Your situation may also change even as you begin TOLAC. Your health care provider may not want you to attempt a VBAC if you:  Need to have labor started (induced) because your cervix is not ready for labor.  Have never had a vaginal delivery.  Have had more than two cesarean deliveries.  Are overdue.  Are pregnant with a very large baby.  Have a condition that causes high blood pressure (preeclampsia). Questions to ask your health care provider  Am I a good candidate for TOLAC?  What are my chances of a successful vaginal delivery?  Is my preferred birth location equipped for a TOLAC?  What are my pain management options during a TOLAC? Where to find more information  American Congress of Obstetricians and Gynecologists: www.acog.org  [employer]-Mid[employer]: www.midwife.org Summary  Vaginal birth after cesarean delivery (VBAC)  is giving birth vaginally after previously delivering a baby through a cesarean section (C-section).  VBAC may be a safe and appropriate option for you depending on your medical history and other risk factors. Talk with your health care provider about the options available to you, and the risks and benefits of each early in your pregnancy.  TOLAC should be attempted in facilities where emergency cesarean section procedures can be performed. This information is not intended to replace advice given to you by your health care provider. Make sure you discuss any questions you have with your health care provider. Document Released: 10/05/2010 Document Revised: 05/15/2018 Document Reviewed: 04/28/2016  Elsevier Patient Education  2020 [place]. Fetal Movement Counts Patient Name: ________________________________________________ Patient Due Date: ____________________ What is a fetal movement count?  A fetal movement count is the number of times that you feel your baby move during a certain amount of time. This may also be called a fetal kick count. A fetal movement count is recommended for every pregnant woman. You may be asked to start counting fetal movements as early as week 28 of your pregnancy. Pay attention to when your baby is most active. You may notice your baby's sleep and wake cycles. You may also notice things that make your baby move more. You should do a fetal movement count:  When your baby is normally most active.  At the same time each day. A good time to count movements is while you are resting, after having something to eat and drink. How do I count fetal movements? 1. Find a quiet, comfortable area. Sit, or lie down on your side. 2. Write down the date, the start time and stop time, and the number of movements that you felt between those two times. Take this information with you to your health care visits. 3. For 2 hours, count kicks, flutters, swishes, rolls, and jabs. You should feel at least 10 movements during 2 hours. 4. You may stop counting after you have felt 10 movements. 5. If you do not feel 10 movements in 2 hours, have something to eat and drink. Then, keep resting and counting for 1 hour. If you feel at least 4 movements during that hour, you may stop counting. Contact a health care provider if:  You feel fewer than 4 movements in 2 hours.  Your baby is not moving like he or she usually does. Date: ____________ Start time: ____________ Stop time: ____________ Movements: ____________ Date: ____________ Start time: ____________ Stop time: ____________ Movements: ____________ Date: ____________ Start time: ____________ Stop time: ____________ Movements:  ____________ Date: ____________ Start time: ____________ Stop time: ____________ Movements: ____________ Date: ____________ Start time: ____________ Stop time: ____________ Movements: ____________ Date: ____________ Start time: ____________ Stop time: ____________ Movements: ____________ Date: ____________ Start time: ____________ Stop time: ____________ Movements: ____________ Date: ____________ Start time: ____________ Stop time: ____________ Movements: ____________ Date: ____________ Start time: ____________ Stop time: ____________ Movements: ____________ This information is not intended to replace advice given to you by your health care provider. Make sure you discuss any questions you have with your health care provider. Document Released: 02/16/2006 Document Revised: 02/06/2018 Document Reviewed: 02/26/2015 Elsevier Patient Education  2020 [employer].

## 2018-10-11 NOTE — Progress Notes (Signed)
Rob-Reports unable to start Fusion Plus due to medication currently back ordered. Samples of Irospan given. Requests my presence at her birth, agreed. Cultures collected today due to history of preterm birth, see orders. Anticipatory guidance regarding course of prenatal care. Reviewed red flag symptoms, signs of preterm labor, and when to call. RTC x 1 week for growth ultrasound and ROB or sooner if needed.

## 2018-10-14 LAB — STREP GP B NAA: Strep Gp B NAA: POSITIVE — AB

## 2018-10-15 ENCOUNTER — Encounter: Payer: Self-pay | Admitting: Certified Nurse Midwife

## 2018-10-15 DIAGNOSIS — B951 Streptococcus, group B, as the cause of diseases classified elsewhere: Secondary | ICD-10-CM | POA: Insufficient documentation

## 2018-10-18 ENCOUNTER — Ambulatory Visit (INDEPENDENT_AMBULATORY_CARE_PROVIDER_SITE_OTHER): Payer: Managed Care, Other (non HMO)

## 2018-10-18 ENCOUNTER — Other Ambulatory Visit (HOSPITAL_COMMUNITY)
Admission: RE | Admit: 2018-10-18 | Discharge: 2018-10-18 | Disposition: A | Discharge status: Home / Self Care | Payer: Managed Care, Other (non HMO) | Source: Ambulatory Visit | Attending: Certified Nurse Midwife | Admitting: Certified Nurse Midwife

## 2018-10-18 ENCOUNTER — Other Ambulatory Visit: Payer: Self-pay

## 2018-10-18 ENCOUNTER — Ambulatory Visit (INDEPENDENT_AMBULATORY_CARE_PROVIDER_SITE_OTHER): Payer: Managed Care, Other (non HMO) | Admitting: Certified Nurse Midwife

## 2018-10-18 VITALS — BP 102/59 | HR 86 | Wt 143.1 lb

## 2018-10-18 DIAGNOSIS — Z3493 Encounter for supervision of normal pregnancy, unspecified, third trimester: Secondary | ICD-10-CM | POA: Insufficient documentation

## 2018-10-18 DIAGNOSIS — Z8759 Personal history of other complications of pregnancy, childbirth and the puerperium: Secondary | ICD-10-CM

## 2018-10-18 DIAGNOSIS — N898 Other specified noninflammatory disorders of vagina: Secondary | ICD-10-CM | POA: Insufficient documentation

## 2018-10-18 DIAGNOSIS — Z3A36 36 weeks gestation of pregnancy: Secondary | ICD-10-CM

## 2018-10-18 DIAGNOSIS — O26893 Other specified pregnancy related conditions, third trimester: Secondary | ICD-10-CM | POA: Diagnosis present

## 2018-10-18 DIAGNOSIS — Z8751 Personal history of pre-term labor: Secondary | ICD-10-CM | POA: Diagnosis not present

## 2018-10-18 DIAGNOSIS — O34219 Maternal care for unspecified type scar from previous cesarean delivery: Secondary | ICD-10-CM | POA: Diagnosis not present

## 2018-10-18 DIAGNOSIS — O0993 Supervision of high risk pregnancy, unspecified, third trimester: Secondary | ICD-10-CM

## 2018-10-18 LAB — POCT URINALYSIS DIPSTICK OB
Bilirubin, UA: NEGATIVE
Blood, UA: NEGATIVE
Glucose, UA: NEGATIVE
Ketones, UA: NEGATIVE
Nitrite, UA: NEGATIVE
Spec Grav, UA: 1.01 (ref 1.010–1.025)
Urobilinogen, UA: 0.2 E.U./dL
pH, UA: 7 (ref 5.0–8.0)

## 2018-10-18 LAB — GC/CHLAMYDIA PROBE AMP
Chlamydia trachomatis, NAA: NEGATIVE
Neisseria Gonorrhoeae by PCR: NEGATIVE

## 2018-10-18 NOTE — Progress Notes (Signed)
ROB-Reports brown and white vaginal discharge without itching for the last week. Swab collected, will contact patient with results. Growth ultrasound and AFI  wnl except grade 3 placenta; see below. Will start weekly NST due to history of preterm birth, placental abruption and grade 3 placenta. Discussed GBS positive status and need for antibiotic treatment in pregnancy; pt verbalized understanding. Reviewed red flag symptoms, signs of labor, and when to call. RTC x 1 week for ROB or sooner if needed.   ULTRASOUND REPORT  Location: [facility] Date of Service: 10/18/2018   Indications:growth/afi Findings:  Tammie Burke intrauterine pregnancy is visualized with FHR at 121 BPM. Biometrics give an (U/S) Gestational age of [redacted]w[redacted]d and an (U/S) EDD of 11/20/2018; this correlates with the clinically established Estimated Date of Delivery: 11/14/18.  Fetal presentation is Cephalic.  Placenta: posterior. Grade: 3 AFI: 14.9 cm  Growth percentile is 30%. EFW: 2581 g ( 5 lb 11 oz )   Impression: 1. [redacted]w[redacted]d Viable Singleton Intrauterine pregnancy previously established criteria. 2. Growth is 30 %ile.  AFI is 14.9 cm.   Recommendations: 1.Clinical correlation with the patient's History and Physical Exam.

## 2018-10-18 NOTE — Patient Instructions (Addendum)
Nonstress Test A nonstress test is a procedure that is done during pregnancy in order to check the baby's heartbeat. The procedure can help show if the baby (fetus) is healthy. It is commonly done when:  The baby is past his or her due date.  The pregnancy is high risk.  The baby is moving less than normal.  The mother has lost a pregnancy in the past.  The health care provider suspects a problem with the baby's growth.  There is too much or too little amniotic fluid. The procedure is often done in the third trimester of pregnancy to find out if an early delivery is needed and whether such a delivery is safe. During a nonstress test, the baby's heartbeat is monitored when the baby is resting and when the baby is moving. If the baby is healthy, the heart rate will increase when he or she moves or kicks and will return to normal when he or she rests. Tell a health care provider about:  Any allergies you have.  Any medical conditions you have.  All medicines you are taking, including vitamins, herbs, eye drops, creams, and over-the-counter medicines. What are the risks? There are no risks to you or your baby from a nonstress test. This procedure should not be painful or uncomfortable. What happens before the procedure?  Eat a meal right before the test or as directed by your health care provider. Food may help encourage the baby to move.  Use the restroom right before the test. What happens during the procedure?  Two monitors will be placed on your abdomen. One will record the baby's heart rate and the other will record the contractions of your uterus.  You may be asked to lie down on your side or to sit upright.  You may be given a button to press when you feel your baby move.  Your health care provider will listen to your baby's heartbeat and recorded it. He or she may also watch your baby's heartbeat on a screen.  If the baby seems to be sleeping, you may be asked to drink  some juice or soda, eat a snack, or change positions. The procedure may vary among health care providers and hospitals. What happens after the procedure?  Your health care provider will discuss the test results with you and make recommendations for the future. Depending on the results, your health care provider may order additional tests or another course of action.  If your health care provider gave you any diet or activity instructions, make sure to follow them.  Keep all follow-up visits as told by your health care provider. This is important. Summary  A nonstress test is a procedure that is done during pregnancy in order to check the baby's heartbeat. The procedure can help show if the baby is healthy.  The procedure is often done in the third trimester of pregnancy to find out if an early delivery is needed and whether such a delivery is safe.  During a nonstress test, the baby's heartbeat is monitored when the baby is resting and when the baby is moving. If the baby is healthy, the heart rate will increase when he or she moves or kicks and will return to normal when he or she rests.  Your health care provider will discuss the test results with you and make recommendations for the future. This information is not intended to replace advice given to you by your health care provider. Make sure you discuss any   questions you have with your health care provider. Document Released: 01/07/2002 Document Revised: 04/28/2016 Document Reviewed: 04/28/2016 Elsevier Patient Education  2020 Elsevier Inc. Fetal Movement Counts Patient Name: ________________________________________________ Patient Due Date: ____________________ What is a fetal movement count?  A fetal movement count is the number of times that you feel your baby move during a certain amount of time. This may also be called a fetal kick count. A fetal movement count is recommended for every pregnant woman. You may be asked to start  counting fetal movements as early as week 28 of your pregnancy. Pay attention to when your baby is most active. You may notice your baby's sleep and wake cycles. You may also notice things that make your baby move more. You should do a fetal movement count:  When your baby is normally most active.  At the same time each day. A good time to count movements is while you are resting, after having something to eat and drink. How do I count fetal movements? 1. Find a quiet, comfortable area. Sit, or lie down on your side. 2. Write down the date, the start time and stop time, and the number of movements that you felt between those two times. Take this information with you to your health care visits. 3. For 2 hours, count kicks, flutters, swishes, rolls, and jabs. You should feel at least 10 movements during 2 hours. 4. You may stop counting after you have felt 10 movements. 5. If you do not feel 10 movements in 2 hours, have something to eat and drink. Then, keep resting and counting for 1 hour. If you feel at least 4 movements during that hour, you may stop counting. Contact a health care provider if:  You feel fewer than 4 movements in 2 hours.  Your baby is not moving like he or she usually does. Date: ____________ Start time: ____________ Stop time: ____________ Movements: ____________ Date: ____________ Start time: ____________ Stop time: ____________ Movements: ____________ Date: ____________ Start time: ____________ Stop time: ____________ Movements: ____________ Date: ____________ Start time: ____________ Stop time: ____________ Movements: ____________ Date: ____________ Start time: ____________ Stop time: ____________ Movements: ____________ Date: ____________ Start time: ____________ Stop time: ____________ Movements: ____________ Date: ____________ Start time: ____________ Stop time: ____________ Movements: ____________ Date: ____________ Start time: ____________ Stop time: ____________  Movements: ____________ Date: ____________ Start time: ____________ Stop time: ____________ Movements: ____________ This information is not intended to replace advice given to you by your health care provider. Make sure you discuss any questions you have with your health care provider. Document Released: 02/16/2006 Document Revised: 02/06/2018 Document Reviewed: 02/26/2015 Elsevier Patient Education  2020 Elsevier Inc.  

## 2018-10-18 NOTE — Progress Notes (Signed)
ROB-Patient c/o thick brown and white vaginal discharge x1 week, no itching, no odor.

## 2018-10-22 ENCOUNTER — Other Ambulatory Visit (INDEPENDENT_AMBULATORY_CARE_PROVIDER_SITE_OTHER): Payer: Managed Care, Other (non HMO) | Admitting: Certified Nurse Midwife

## 2018-10-22 DIAGNOSIS — B379 Candidiasis, unspecified: Secondary | ICD-10-CM

## 2018-10-22 LAB — CERVICOVAGINAL ANCILLARY ONLY
Bacterial Vaginitis (gardnerella): NEGATIVE
Candida Glabrata: NEGATIVE
Candida Vaginitis: POSITIVE — AB
Molecular Disclaimer: NEGATIVE
Molecular Disclaimer: NEGATIVE
Molecular Disclaimer: NORMAL

## 2018-10-22 MED ORDER — TERCONAZOLE 0.4 % VA CREA: 1.0000 | TOPICAL_CREAM | Freq: Every day | VAGINAL | 0 refills | Status: DC

## 2018-10-25 ENCOUNTER — Ambulatory Visit (INDEPENDENT_AMBULATORY_CARE_PROVIDER_SITE_OTHER): Payer: Managed Care, Other (non HMO) | Admitting: Certified Nurse Midwife

## 2018-10-25 ENCOUNTER — Other Ambulatory Visit: Payer: Self-pay

## 2018-10-25 ENCOUNTER — Other Ambulatory Visit: Payer: Managed Care, Other (non HMO)

## 2018-10-25 VITALS — BP 104/54 | HR 82

## 2018-10-25 DIAGNOSIS — Z3493 Encounter for supervision of normal pregnancy, unspecified, third trimester: Secondary | ICD-10-CM

## 2018-10-25 NOTE — Addendum Note (Signed)
Addended by: Garner Nash on: 10/25/2018 03:38 PM   Modules accepted: Orders

## 2018-10-25 NOTE — Patient Instructions (Addendum)
Nonstress Test A nonstress test is a procedure that is done during pregnancy in order to check the baby's heartbeat. The procedure can help show if the baby (fetus) is healthy. It is commonly done when:  The baby is past his or her due date.  The pregnancy is high risk.  The baby is moving less than normal.  The mother has lost a pregnancy in the past.  The health care provider suspects a problem with the baby's growth.  There is too much or too little amniotic fluid. The procedure is often done in the third trimester of pregnancy to find out if an early delivery is needed and whether such a delivery is safe. During a nonstress test, the baby's heartbeat is monitored when the baby is resting and when the baby is moving. If the baby is healthy, the heart rate will increase when he or she moves or kicks and will return to normal when he or she rests. Tell a health care provider about:  Any allergies you have.  Any medical conditions you have.  All medicines you are taking, including vitamins, herbs, eye drops, creams, and over-the-counter medicines. What are the risks? There are no risks to you or your baby from a nonstress test. This procedure should not be painful or uncomfortable. What happens before the procedure?  Eat a meal right before the test or as directed by your health care provider. Food may help encourage the baby to move.  Use the restroom right before the test. What happens during the procedure?  Two monitors will be placed on your abdomen. One will record the baby's heart rate and the other will record the contractions of your uterus.  You may be asked to lie down on your side or to sit upright.  You may be given a button to press when you feel your baby move.  Your health care provider will listen to your baby's heartbeat and recorded it. He or she may also watch your baby's heartbeat on a screen.  If the baby seems to be sleeping, you may be asked to drink  some juice or soda, eat a snack, or change positions. The procedure may vary among health care providers and hospitals. What happens after the procedure?  Your health care provider will discuss the test results with you and make recommendations for the future. Depending on the results, your health care provider may order additional tests or another course of action.  If your health care provider gave you any diet or activity instructions, make sure to follow them.  Keep all follow-up visits as told by your health care provider. This is important. Summary  A nonstress test is a procedure that is done during pregnancy in order to check the baby's heartbeat. The procedure can help show if the baby is healthy.  The procedure is often done in the third trimester of pregnancy to find out if an early delivery is needed and whether such a delivery is safe.  During a nonstress test, the baby's heartbeat is monitored when the baby is resting and when the baby is moving. If the baby is healthy, the heart rate will increase when he or she moves or kicks and will return to normal when he or she rests.  Your health care provider will discuss the test results with you and make recommendations for the future. This information is not intended to replace advice given to you by your health care provider. Make sure you discuss any   questions you have with your health care provider. Document Released: 01/07/2002 Document Revised: 04/28/2016 Document Reviewed: 04/28/2016 Elsevier Patient Education  2020 Elsevier Inc. Fetal Movement Counts Patient Name: ________________________________________________ Patient Due Date: ____________________ What is a fetal movement count?  A fetal movement count is the number of times that you feel your baby move during a certain amount of time. This may also be called a fetal kick count. A fetal movement count is recommended for every pregnant woman. You may be asked to start  counting fetal movements as early as week 28 of your pregnancy. Pay attention to when your baby is most active. You may notice your baby's sleep and wake cycles. You may also notice things that make your baby move more. You should do a fetal movement count:  When your baby is normally most active.  At the same time each day. A good time to count movements is while you are resting, after having something to eat and drink. How do I count fetal movements? 1. Find a quiet, comfortable area. Sit, or lie down on your side. 2. Write down the date, the start time and stop time, and the number of movements that you felt between those two times. Take this information with you to your health care visits. 3. For 2 hours, count kicks, flutters, swishes, rolls, and jabs. You should feel at least 10 movements during 2 hours. 4. You may stop counting after you have felt 10 movements. 5. If you do not feel 10 movements in 2 hours, have something to eat and drink. Then, keep resting and counting for 1 hour. If you feel at least 4 movements during that hour, you may stop counting. Contact a health care provider if:  You feel fewer than 4 movements in 2 hours.  Your baby is not moving like he or she usually does. Date: ____________ Start time: ____________ Stop time: ____________ Movements: ____________ Date: ____________ Start time: ____________ Stop time: ____________ Movements: ____________ Date: ____________ Start time: ____________ Stop time: ____________ Movements: ____________ Date: ____________ Start time: ____________ Stop time: ____________ Movements: ____________ Date: ____________ Start time: ____________ Stop time: ____________ Movements: ____________ Date: ____________ Start time: ____________ Stop time: ____________ Movements: ____________ Date: ____________ Start time: ____________ Stop time: ____________ Movements: ____________ Date: ____________ Start time: ____________ Stop time: ____________  Movements: ____________ Date: ____________ Start time: ____________ Stop time: ____________ Movements: ____________ This information is not intended to replace advice given to you by your health care provider. Make sure you discuss any questions you have with your health care provider. Document Released: 02/16/2006 Document Revised: 02/06/2018 Document Reviewed: 02/26/2015 Elsevier Patient Education  2020 Elsevier Inc.  

## 2018-10-25 NOTE — Progress Notes (Signed)
ROB and NST, no questions or concerns. Anticipatory guidance regarding course of prenatal care. Reviewed red flag symptoms, signs of labor and when to call. RTC x 1 week for NST and ROB or sooner if needed.    NONSTRESS TEST INTERPRETATION  INDICATIONS: History of preterm birh, hx placental abruption, grade 3 placenta  FHR baseline: 130-135 bpm RESULTS:Reactive COMMENTS: Uterine irritability present   PLAN: 1. Continue fetal kick counts twice a day. 2. Continue antepartum testing as scheduled   Diona Fanti, CNM [facility], [facility] 10/25/18 2:43 PM

## 2018-10-28 ENCOUNTER — Encounter: Payer: Self-pay | Admitting: Certified Nurse Midwife

## 2018-10-28 ENCOUNTER — Inpatient Hospital Stay
Admission: EM | Admit: 2018-10-28 | Discharge: 2018-10-30 | DRG: 807 | Disposition: A | Discharge status: Home / Self Care | Payer: Managed Care, Other (non HMO) | Attending: Certified Nurse Midwife | Admitting: Certified Nurse Midwife

## 2018-10-28 ENCOUNTER — Other Ambulatory Visit: Payer: Self-pay

## 2018-10-28 DIAGNOSIS — O99824 Streptococcus B carrier state complicating childbirth: Secondary | ICD-10-CM | POA: Diagnosis present

## 2018-10-28 DIAGNOSIS — O9902 Anemia complicating childbirth: Secondary | ICD-10-CM | POA: Diagnosis present

## 2018-10-28 DIAGNOSIS — D649 Anemia, unspecified: Secondary | ICD-10-CM | POA: Diagnosis present

## 2018-10-28 DIAGNOSIS — Z20828 Contact with and (suspected) exposure to other viral communicable diseases: Secondary | ICD-10-CM | POA: Diagnosis present

## 2018-10-28 DIAGNOSIS — O26893 Other specified pregnancy related conditions, third trimester: Secondary | ICD-10-CM | POA: Diagnosis present

## 2018-10-28 DIAGNOSIS — O34219 Maternal care for unspecified type scar from previous cesarean delivery: Secondary | ICD-10-CM | POA: Diagnosis present

## 2018-10-28 DIAGNOSIS — O34211 Maternal care for low transverse scar from previous cesarean delivery: Secondary | ICD-10-CM

## 2018-10-28 DIAGNOSIS — Z23 Encounter for immunization: Secondary | ICD-10-CM | POA: Diagnosis not present

## 2018-10-28 DIAGNOSIS — Z3A37 37 weeks gestation of pregnancy: Secondary | ICD-10-CM | POA: Diagnosis not present

## 2018-10-28 LAB — TYPE AND SCREEN
ABO/RH(D): O POS
Antibody Screen: NEGATIVE

## 2018-10-28 LAB — CBC
HCT: 31.9 % — ABNORMAL LOW (ref 36.0–46.0)
Hemoglobin: 10.3 g/dL — ABNORMAL LOW (ref 12.0–15.0)
MCH: 26.1 pg (ref 26.0–34.0)
MCHC: 32.3 g/dL (ref 30.0–36.0)
MCV: 80.8 fL (ref 80.0–100.0)
Platelets: 148 10*3/uL — ABNORMAL LOW (ref 150–400)
RBC: 3.95 MIL/uL (ref 3.87–5.11)
RDW: 15.2 % (ref 11.5–15.5)
WBC: 22.9 10*3/uL — ABNORMAL HIGH (ref 4.0–10.5)
nRBC: 0 % (ref 0.0–0.2)

## 2018-10-28 LAB — SARS CORONAVIRUS 2 BY RT PCR (HOSPITAL ORDER, PERFORMED IN ~~LOC~~ HOSPITAL LAB): SARS Coronavirus 2: NEGATIVE

## 2018-10-28 MED ORDER — DIPHENHYDRAMINE HCL 25 MG PO CAPS: 25.0000 mg | ORAL_CAPSULE | Freq: Four times a day (QID) | ORAL | Status: DC | PRN

## 2018-10-28 MED ORDER — SIMETHICONE 80 MG PO CHEW: 80.0000 mg | CHEWABLE_TABLET | ORAL | Status: DC | PRN

## 2018-10-28 MED ORDER — BUTORPHANOL TARTRATE 1 MG/ML IJ SOLN: 1.0000 mg | INTRAMUSCULAR | Status: DC | PRN

## 2018-10-28 MED ORDER — OXYTOCIN 10 UNIT/ML IJ SOLN
10.0000 [IU] | Freq: Once | INTRAMUSCULAR | Status: AC
  Administered 2018-10-28: 10 [IU] via INTRAMUSCULAR

## 2018-10-28 MED ORDER — SOD CITRATE-CITRIC ACID 500-334 MG/5ML PO SOLN: 30.0000 mL | ORAL | Status: DC | PRN

## 2018-10-28 MED ORDER — FERROUS SULFATE 325 (65 FE) MG PO TABS
325.0000 mg | ORAL_TABLET | Freq: Two times a day (BID) | ORAL | Status: DC
  Administered 2018-10-28 – 2018-10-30 (×4): 325 mg via ORAL
  Filled 2018-10-28 (×4): qty 1

## 2018-10-28 MED ORDER — DIBUCAINE (PERIANAL) 1 % EX OINT: 1.0000 "application " | TOPICAL_OINTMENT | CUTANEOUS | Status: DC | PRN

## 2018-10-28 MED ORDER — METHYLERGONOVINE MALEATE 0.2 MG PO TABS
0.2000 mg | ORAL_TABLET | ORAL | Status: DC | PRN
  Filled 2018-10-28: qty 1

## 2018-10-28 MED ORDER — SENNOSIDES-DOCUSATE SODIUM 8.6-50 MG PO TABS
2.0000 | ORAL_TABLET | ORAL | Status: DC
  Administered 2018-10-29 – 2018-10-30 (×2): 2 via ORAL
  Filled 2018-10-28 (×2): qty 2

## 2018-10-28 MED ORDER — METHYLERGONOVINE MALEATE 0.2 MG/ML IJ SOLN: 0.2000 mg | INTRAMUSCULAR | Status: DC | PRN

## 2018-10-28 MED ORDER — OXYCODONE-ACETAMINOPHEN 5-325 MG PO TABS: 2.0000 | ORAL_TABLET | ORAL | Status: DC | PRN

## 2018-10-28 MED ORDER — IBUPROFEN 600 MG PO TABS
ORAL_TABLET | ORAL | Status: AC
  Administered 2018-10-28: 600 mg via ORAL
  Filled 2018-10-28: qty 1

## 2018-10-28 MED ORDER — LIDOCAINE HCL (PF) 1 % IJ SOLN
30.0000 mL | INTRAMUSCULAR | Status: AC | PRN
  Administered 2018-10-28: 30 mL via SUBCUTANEOUS

## 2018-10-28 MED ORDER — ONDANSETRON HCL 4 MG/2ML IJ SOLN: 4.0000 mg | INTRAMUSCULAR | Status: DC | PRN

## 2018-10-28 MED ORDER — ONDANSETRON HCL 4 MG PO TABS: 4.0000 mg | ORAL_TABLET | ORAL | Status: DC | PRN

## 2018-10-28 MED ORDER — OXYCODONE-ACETAMINOPHEN 5-325 MG PO TABS: 1.0000 | ORAL_TABLET | ORAL | Status: DC | PRN

## 2018-10-28 MED ORDER — MISOPROSTOL 200 MCG PO TABS
800.0000 ug | ORAL_TABLET | Freq: Once | ORAL | Status: AC
  Administered 2018-10-28: 08:00:00 800 ug via RECTAL

## 2018-10-28 MED ORDER — IBUPROFEN 600 MG PO TABS
600.0000 mg | ORAL_TABLET | Freq: Four times a day (QID) | ORAL | Status: DC
  Administered 2018-10-28 – 2018-10-29 (×4): 600 mg via ORAL
  Filled 2018-10-28 (×3): qty 1

## 2018-10-28 MED ORDER — ACETAMINOPHEN 325 MG PO TABS: 650.0000 mg | ORAL_TABLET | ORAL | Status: DC | PRN

## 2018-10-28 MED ORDER — ACETAMINOPHEN 325 MG PO TABS
650.0000 mg | ORAL_TABLET | ORAL | Status: DC | PRN
  Administered 2018-10-28 – 2018-10-30 (×7): 650 mg via ORAL
  Filled 2018-10-28 (×7): qty 2

## 2018-10-28 MED ORDER — INFLUENZA VAC SPLIT QUAD 0.5 ML IM SUSY
0.5000 mL | PREFILLED_SYRINGE | INTRAMUSCULAR | Status: AC
  Administered 2018-10-29: 0.5 mL via INTRAMUSCULAR
  Filled 2018-10-28 (×2): qty 0.5

## 2018-10-28 MED ORDER — WITCH HAZEL-GLYCERIN EX PADS: 1.0000 "application " | MEDICATED_PAD | CUTANEOUS | Status: DC | PRN

## 2018-10-28 MED ORDER — BENZOCAINE-MENTHOL 20-0.5 % EX AERO
1.0000 "application " | INHALATION_SPRAY | CUTANEOUS | Status: DC | PRN
  Administered 2018-10-28: 1 via TOPICAL
  Filled 2018-10-28: qty 56

## 2018-10-28 MED ORDER — COCONUT OIL OIL: 1.0000 "application " | TOPICAL_OIL | Status: DC | PRN

## 2018-10-28 MED ORDER — PRENATAL MULTIVITAMIN CH
1.0000 | ORAL_TABLET | Freq: Every day | ORAL | Status: DC
  Administered 2018-10-28 – 2018-10-29 (×2): 1 via ORAL
  Filled 2018-10-28 (×2): qty 1

## 2018-10-28 MED ORDER — ONDANSETRON HCL 4 MG/2ML IJ SOLN: 4.0000 mg | Freq: Four times a day (QID) | INTRAMUSCULAR | Status: DC | PRN

## 2018-10-28 NOTE — H&P (Signed)
Obstetric History and Physical  Tammie Burke is a 28 y.o. G2P0101 with IUP at [redacted]w[redacted]d presenting with urge to push.   Patient states she has been having regular contractions, none vaginal bleeding, intact membranes, with active fetal movement.    Denies difficulty breathing or respiratory distress, chest pain, dysuria, and leg pain or swelling.   Prenatal Course  Source of Care: [facility]-initial visit at 8 wks, total visits: 10  Pregnancy complications or risks: Parvo IgG positive, History of preterm birth, History of placental abruption, previous cesarean section due to arrest of descent-desires trial of labor  Prenatal labs and studies:  ABO, Rh: O/Positive/-- 05-02-2022 1518)  Antibody: Negative 05-02-2022 1518)  Rubella: 16.90 02-May-2022 1518)  Varicella: 1, 503 (03/23 1636)  RPR: Non Reactive (08/12 1140)   HBsAg: Negative 2022-05-02 1518)   HIV: Non Reactive 02-May-2022 1518)   GBS:--/Positive (09/11 0819)  1 hr Glucola: 124 (08/12 1140)  Genetic screening: Declined  Anatomy US: Complete, normal (05/27 1423)  Past Medical History:  Diagnosis Date  . Anemia   . 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 CESAREAN SECTION N/A 02/19/2016   Procedure: CESAREAN SECTION;  Surgeon: Will Bonnet, MD;  Location: [facility] ORS;  Service: Obstetrics;  Laterality: N/A;    OB History  Gravida Para Term Preterm AB Living  2 1 0 1   1  SAB TAB Ectopic Multiple Live Births          1    # Outcome Date GA Lbr Len/2nd Weight Sex Delivery Anes PTL Lv  2 Current           1 Preterm 02/19/16 [redacted]w[redacted]d  2296 g F CS-LTranv  Y LIV     Complications: Failure to Progress in Second Stage    Social History   Socioeconomic History  . Marital status: Married    Spouse name: Catering manager  . Number of children: 1  . Years of education: Not on file  . Highest education level: Some college, no degree  Occupational History  . Occupation: CNA  Social Needs  .  Financial resource strain: Not on file  . Food insecurity    Worry: Not on file    Inability: Not on file  . Transportation needs    Medical: Not on file    Non-medical: Not on file  Tobacco Use  . Smoking status: Never Smoker  . Smokeless tobacco: Never Used  Substance and Sexual Activity  . Alcohol use: No  . Drug use: No  . Sexual activity: Yes    Birth control/protection: None  Lifestyle  . Physical activity    Days per week: Not on file    Minutes per session: Not on file  . Stress: Not on file  Relationships  . Social Herbalist on phone: Not on file    Gets together: Not on file    Attends religious service: Not on file    Active member of club or organization: Not on file    Attends meetings of clubs or organizations: Not on file    Relationship status: Not on file  Other Topics Concern  . Not on file  Social History Narrative  . Not on file    Family History  Problem Relation Age of Onset  . Healthy Mother   . Healthy Sister   . Healthy Brother   . Cancer Neg Hx   . Diabetes Neg Hx   .  Hypertension Neg Hx   . Thyroid disease Neg Hx     Medications Prior to Admission  Medication Sig Dispense Refill Last Dose  . Iron-FA-B Cmp-C-Biot-Probiotic (FUSION PLUS) CAPS Take 1 tablet by mouth daily. 30 capsule 6   . Prenatal Vit-Fe Phos-FA-Omega (VITAFOL GUMMIES) 3.33-0.333-34.8 MG CHEW Chew 3 each by mouth daily. 90 tablet 10   . terconazole (TERAZOL 7) 0.4 % vaginal cream Place 1 applicator vaginally at bedtime. 45 g 0     No Known Allergies  Review of Systems: Negative except for what is mentioned in HPI.  Physical Exam:  BP 118/75   Pulse 88   Temp 97.9 F (36.6 C) (Oral)   LMP 02/17/2018   GENERAL: Well-developed, well-nourished female in no acute distress.   LUNGS: Clear to auscultation bilaterally.   HEART: Regular rate and rhythm.  ABDOMEN: Soft, nontender, nondistended, gravid.  EXTREMITIES: Nontender, no edema, 2+ distal  pulses.  Cervical Exam: Dilation: 10 Dilation Complete Date: 10/28/18 Dilation Complete Time: 0645 Effacement (%): 100 Station: Plus 1 Exam by:: Staebler  Fetal wellbeing: Category I  Contractions: One (1) to three (3) minutes, soft resting tone   Pertinent Labs/Studies:    No results found for this or any previous visit (from the past 24 hour(s)).  Assessment :  Tammie Burke is a 28 y.o. G2P0101 at [redacted]w[redacted]d being admitted for labor, Rh positive, GBS positive, Parvo IgG positive, History of preterm birth, History of placental abruption, previous cesarean section due to arrest of descent-desires trial of labor  FHR Category I  Plan:  Admit to birthing suites, see orders.   Labor: Expectant management.   Delivery plan: Hopeful for vaginal delivery.   Dr. Logan Bores and OR contact regarding patient admission and plan of care.    Gunnar Bulla, CNM [facility], [facility] 10/28/18 8:09 AM

## 2018-10-28 NOTE — Lactation Note (Signed)
This note was copied from a baby's chart. Lactation Consultation Note  Patient Name: Tammie Burke [id number] Date: 10/28/2018   Observed a couple of breast feeds today with mom latching Dua without assistance other than tweeking positioning and pillow support.  Demonstrated hand expression of colostrum.  [name] has strong rhythmic sucking with swallows.  Mom breast fed first baby for 2 years without complications.  Mom has [employer].  Discussed with parents how to go about getting DEBP through insurance.  AEROFLOW hand out given.  Reviewed newborn feeding cues and mom is putting [name] to the breast whenever he demonstrated hunger cues.  Reviewed newborn stomach size, supply and demand, normal course of lactation and routine newborn feeding patterns.  Lactation name and number written on white board and encouraged to call with any questions, concerns or assistance.  Maternal Data    Feeding Feeding Type: Breast Fed  LATCH Score                   Interventions    Lactation Tools Discussed/Used     Consult Status      Jarold Motto 10/28/2018, 9:51 PM

## 2018-10-29 LAB — CBC
HCT: 30.4 % — ABNORMAL LOW (ref 36.0–46.0)
Hemoglobin: 9.8 g/dL — ABNORMAL LOW (ref 12.0–15.0)
MCH: 26.1 pg (ref 26.0–34.0)
MCHC: 32.2 g/dL (ref 30.0–36.0)
MCV: 80.9 fL (ref 80.0–100.0)
Platelets: 144 10*3/uL — ABNORMAL LOW (ref 150–400)
RBC: 3.76 MIL/uL — ABNORMAL LOW (ref 3.87–5.11)
RDW: 15.2 % (ref 11.5–15.5)
WBC: 18.1 10*3/uL — ABNORMAL HIGH (ref 4.0–10.5)
nRBC: 0 % (ref 0.0–0.2)

## 2018-10-29 MED ORDER — IBUPROFEN 600 MG PO TABS
600.0000 mg | ORAL_TABLET | Freq: Four times a day (QID) | ORAL | Status: DC
  Administered 2018-10-29 – 2018-10-30 (×4): 600 mg via ORAL
  Filled 2018-10-29 (×4): qty 1

## 2018-10-29 NOTE — Lactation Note (Signed)
This note was copied from a baby's chart. Lactation Consultation Note  Patient Name: Tammie Burke [id number] Date: 10/29/2018 Reason for consult: Follow-up assessment  LC spoke with mom this morning as she was breastfeeding [place]. She reports that Tammie Burke has been feeding well, waking more often, and staying alert longer. Stool and wet diapers have increased over night. Mom reports no pain or discomfort. Mom reminds [place] that she breastfed older child for 2 years, she had no questions/concerns. Helped raise the bed to support mom's back, encouraged to bring baby to breast, and not lean over with baby, and provided a pillow under arm supporting baby's head. [facility] name and number written on the whiteboard. Encouraged mom to call out with questions/concerns.  Maternal Data Formula Feeding for Exclusion: No Has patient been taught Hand Expression?: Yes Does the patient have breastfeeding experience prior to this delivery?: Yes  Feeding Feeding Type: Breast Fed  LATCH Score Latch: Grasps breast easily, tongue down, lips flanged, rhythmical sucking.  Audible Swallowing: Spontaneous and intermittent  Type of Nipple: Everted at rest and after stimulation  Comfort (Breast/Nipple): Soft / non-tender  Hold (Positioning): Assistance needed to correctly position infant at breast and maintain latch.  LATCH Score: 9  Interventions Interventions: Breast feeding basics reviewed;Adjust position;Support pillows  Lactation Tools Discussed/Used     Consult Status Consult Status: Follow-up Date: 10/29/18 Follow-up type: In-patient    Lavonia Drafts 10/29/2018, 11:17 AM

## 2018-10-29 NOTE — Progress Notes (Signed)
Patient ID: Tammie Burke, female   DOB: [birth year], 28 y.o.   MRN: [id number]  Post Partum Day # 1, s/p spontaneous vaginal birth after cesarean section, Rh positive, GBS positive, Anemia, Breastfeeding  Subjective:  Patient sitting in bed, eating lunch. Infant at bedside completing hearing screening. FOB at bedside for support.   Denies difficulty breathing or respiratory distress, chest pain, abdominal pain, dysuria, and leg pain or swelling.   Objective: Temp:  [98.1 F (36.7 C)-98.6 F (37 C)] 98.1 F (36.7 C) (09/28 0810) Pulse Rate:  [60-67] 60 (09/28 0810) Resp:  [18-20] 18 (09/28 0810) BP: (108-116)/(63-76) 108/68 (09/28 0810) SpO2:  [98 %-100 %] 100 % (09/28 0810)  Physical Exam:   General: alert and cooperative   Lungs: clear to auscultation bilaterally  Breasts: deferred, no complaints  Heart: normal apical impulse  Abdomen: soft, non-tender; bowel sounds normal; no masses,  no organomegaly  Pelvis: Lochia: appropriate, Uterine Fundus: firm  Extremities: DVT Evaluation: no evidence of DVT seen on physical exam. Negative Homan's sign.  Recent Labs    10/28/18 0935 10/29/18 0508  HGB 10.3* 9.8*  HCT 31.9* 30.4*    Assessment:  Post Partum Day # 1, s/p spontaneous vaginal birth after cesarean section, Rh positive, GBS positive, Anemia  Breastfeeding  Plan:  Routine postpartum care and orders.   Reviewed red flag symptoms and when to call.   Continue orders as written. Reassess as needed.   Anticipate discharge in morning.    LOS: 1 day   Diona Fanti, CNM [facility], [facility] 10/29/2018 1:42 PM

## 2018-10-30 LAB — RPR: RPR Ser Ql: NONREACTIVE — AB

## 2018-10-30 MED ORDER — ACETAMINOPHEN 325 MG PO TABS: 650.0000 mg | ORAL_TABLET | ORAL | 0 refills | Status: DC | PRN

## 2018-10-30 MED ORDER — IBUPROFEN 600 MG PO TABS: 600.0000 mg | ORAL_TABLET | Freq: Four times a day (QID) | ORAL | 0 refills | Status: DC

## 2018-10-30 MED ORDER — FERROUS SULFATE 325 (65 FE) MG PO TABS: 325.0000 mg | ORAL_TABLET | Freq: Two times a day (BID) | ORAL | 3 refills | Status: DC

## 2018-10-30 NOTE — Lactation Note (Signed)
This note was copied from a baby's chart. Lactation Consultation Note  Patient Name: Tammie Burke [id number] Date: 10/30/2018 Reason for consult: Follow-up assessment  LC spoke with parents before discharge. Parents report feedings to have gone well overnight. Mom has no pain or discomfort. Wet/stool diapers appear appropriate in number for infants age.  Reviewed breastfeeding basics, newborn stomach size, feeding frequency during growth spurts, potential for growth spurts. Encouraged to continue tracking wet/dirty diapers. Provided information for outpatient lactation consultations and support as well as virtual breastfeeding support groups post-discharge.  Maternal Data Formula Feeding for Exclusion: No Has patient been taught Hand Expression?: Yes Does the patient have breastfeeding experience prior to this delivery?: Yes  Feeding Feeding Type: Breast Fed  LATCH Score                   Interventions Interventions: Breast feeding basics reviewed  Lactation Tools Discussed/Used     Consult Status Consult Status: Complete Date: 10/30/18 Follow-up type: Call as needed    Lavonia Drafts 10/30/2018, 11:03 AM

## 2018-10-30 NOTE — Discharge Summary (Signed)
  Obstetric Discharge Summary  Patient ID: Tammie Burke MRN: [id number] DOB/AGE: 09/24/1990 28 y.o.   Date of Admission: 10/28/2018  Date of Discharge: 10/30/18  Admitting Diagnosis: Onset of Labor at [redacted]w[redacted]d  Secondary Diagnosis: Parvo IgG positive, History of preterm birth, History of placental abruption, previous cesarean section due to arrest of descent-successful vaginal birth after cesarean section, Rh positive, Rh positive  Mode of Delivery: Normal spontaneous vaginal delivery     Discharge Diagnosis: No other diagnosis   Intrapartum Procedures: None   Post partum procedures: None  Complications: First degree perineal laceration, repaired   Tammie Burke is a [id number] who had a SVD on 10/28/2018;  for further details of this birth, please refer to the delivey summary.  Patient had an uncomplicated postpartum course.  By time of discharge on PPD#2, her pain was controlled on oral pain medications; she had appropriate lochia and was ambulating, voiding without difficulty and tolerating regular diet.  She was deemed stable for discharge to home.    Labs: CBC Latest Ref Rng & Units 10/29/2018 10/28/2018 09/12/2018  WBC 4.0 - 10.5 K/uL 18.1(H) 22.9(H) 12.4(H)  Hemoglobin 12.0 - 15.0 g/dL 9.8(L) 10.3(L) 10.8(L)  Hematocrit 36.0 - 46.0 % 30.4(L) 31.9(L) 33.1(L)  Platelets 150 - 400 K/uL 144(L) 148(L) 143(L)   O POS  Physical exam:   Temp:  [98 F (36.7 C)-98.1 F (36.7 C)] 98 F (36.7 C) (09/29 0728) Pulse Rate:  [65-70] 67 (09/29 0728) Resp:  [18] 18 (09/29 0728) BP: (94-105)/(57-73) 96/68 (09/29 0728) SpO2:  [98 %-100 %] 100 % (09/29 0728)  General: alert and no distress  Lochia: appropriate  Abdomen: soft, NT  Uterine Fundus: firm  Extremities: No evidence of DVT seen on physical exam. No lower extremity edema.  Discharge Instructions: Per After Visit Summary.  Activity: Advance as tolerated. Pelvic rest for 6  weeks.  Also refer to After Visit Summary  Diet: Regular  Medications: Allergies as of 10/30/2018   No Known Allergies     Medication List    STOP taking these medications   Fusion Plus Caps   terconazole 0.4 % vaginal cream Commonly known as: TERAZOL 7     TAKE these medications   acetaminophen 325 MG tablet Commonly known as: Tylenol Take 2 tablets (650 mg total) by mouth every 4 (four) hours as needed (for pain scale < 4).   ferrous sulfate 325 (65 FE) MG tablet Take 1 tablet (325 mg total) by mouth 2 (two) times daily with a meal.   ibuprofen 600 MG tablet Commonly known as: ADVIL Take 1 tablet (600 mg total) by mouth every 6 (six) hours.   Vitafol Gummies 3.33-0.333-34.8 MG Chew Chew 3 each by mouth daily.      Outpatient follow up:  Follow-up Information    Diona Fanti, CNM. Call in 4 week(s).   Specialties: Certified Nurse Midwife, Obstetrics and Gynecology, Radiology Why: Please call to schedule four (4) to six (6) week postpartum visit with [facility] Contact information: [address] [address] [phone]          Postpartum contraception: abstinence; will discuss further at postpartum visit  Discharged Condition: stable  Discharged to: home   Newborn Data:  Disposition:home with mother  Apgars: APGAR (1 MIN): 8   APGAR (5 MINS): 9    Baby Feeding: Breast   Diona Fanti, CNM [facility], [facility] 10/30/18 11:07 AM

## 2018-10-30 NOTE — Discharge Instructions (Signed)
Contraception Choices °Contraception, also called birth control, means things to use or ways to try not to get pregnant. °Hormonal birth control °This kind of birth control uses hormones. Here are some types of hormonal birth control: °· A tube that is put under skin of the arm (implant). The tube can stay in for as long as 3 years. °· Shots to get every 3 months (injections). °· Pills to take every day (birth control pills). °· A patch to change 1 time each week for 3 weeks (birth control patch). After that, the patch is taken off for 1 week. °· A ring to put in the vagina. The ring is left in for 3 weeks. Then it is taken out of the vagina for 1 week. Then a new ring is put in. °· Pills to take after unprotected sex (emergency birth control pills). °Barrier birth control °Here are some types of barrier birth control: °· A thin covering that is put on the penis before sex (female condom). The covering is thrown away after sex. °· A soft, loose covering that is put in the vagina before sex (female condom). The covering is thrown away after sex. °· A rubber bowl that sits over the cervix (diaphragm). The bowl must be made for you. The bowl is put into the vagina before sex. The bowl is left in for 6-8 hours after sex. It is taken out within 24 hours. °· A small, soft cup that fits over the cervix (cervical cap). The cup must be made for you. The cup can be left in for 6-8 hours after sex. It is taken out within 48 hours. °· A sponge that is put into the vagina before sex. It must be left in for at least 6 hours after sex. It must be taken out within 30 hours. Then it is thrown away. °· A chemical that kills or stops sperm from getting into the uterus (spermicide). It may be a pill, cream, jelly, or foam to put in the vagina. The chemical should be used at least 10-15 minutes before sex. °IUD (intrauterine) birth control °An IUD is a small, T-shaped piece of plastic. It is put inside the uterus. There are two  kinds: °· Hormone IUD. This kind can stay in for 3-5 years. °· Copper IUD. This kind can stay in for 10 years. °Permanent birth control °Here are some types of permanent birth control: °· Surgery to block the fallopian tubes. °· Having an insert put into each fallopian tube. °· Surgery to tie off the tubes that carry sperm (vasectomy). °Natural planning birth control °Here are some types of natural planning birth control: °· Not having sex on the days the woman could get pregnant. °· Using a calendar: °? To keep track of the length of each period. °? To find out what days pregnancy can happen. °? To plan to not have sex on days when pregnancy can happen. °· Watching for symptoms of ovulation and not having sex during ovulation. One way the woman can check for ovulation is to check her temperature. °· Waiting to have sex until after ovulation. °Summary °· Contraception, also called birth control, means things to use or ways to try not to get pregnant. °· Hormonal methods of birth control include implants, injections, pills, patches, vaginal rings, and emergency birth control pills. °· Barrier methods of birth control can include female condoms, female condoms, diaphragms, cervical caps, sponges, and spermicides. °· There are two types of IUD (intrauterine device) birth control.   An IUD can be put in a woman's uterus to prevent pregnancy for 3-5 years.  Permanent sterilization can be done through a procedure for males, females, or both.  Natural planning methods involve not having sex on the days when the woman could get pregnant. This information is not intended to replace advice given to you by your health care provider. Make sure you discuss any questions you have with your health care provider. Document Released: 11/14/2008 Document Revised: 05/09/2018 Document Reviewed: 01/28/2016 Elsevier Patient Education  2020 [employer]. Breastfeeding Tips for a Good Latch Latching is how your baby's mouth attaches  to your nipple to breastfeed. It is an important part of breastfeeding. Your baby may have trouble latching for a number of reasons. A poor latch may cause you to have cracked or sore nipples or other problems. Follow these instructions at home: How to position your baby  Find a comfortable place to sit or lie down. Your neck and back should be well supported.  If you are seated, place a pillow or rolled-up blanket under your baby. This will bring him or her to the level of your breast.  Make sure that your baby's belly (abdomen) is facing your belly.  Try different positions to find one that works best for you and your baby. How to help your baby latch   To start, gently rub your breast. Move your fingertips in a circle as you massage from your chest wall toward your nipple. This helps milk flow. Keep doing this during feeding if needed.  Position your breast. Hold your breast with four fingers underneath and your thumb above your nipple. Keep your fingers away from your nipple and your baby's mouth. Follow these steps to help your baby latch: 1. Rub your baby's lips gently with your finger or nipple. 2. When your baby's mouth is open wide enough, quickly bring your baby to your breast and place your whole nipple into your baby's mouth. Place as much of the colored area around your nipple (areola)as possible into your baby's mouth. 3. Your baby's tongue should be between his or her lower gum and your breast. 4. You should be able to see more areola above your baby's upper lip than below the lower lip. 5. When your baby starts sucking, you will feel a gentle pull on your nipple. You should not feel any pain. Be patient. It is common for a baby to suck for about 2-3 minutes to start the flow of breast milk. 6. Make sure that your baby's mouth is in the right position around your nipple. Your baby's lips should make a seal on your breast and be turned outward.  General instructions  Look for  these signs that your baby has latched on to your nipple: ? The baby is quietly tugging or sucking without causing you pain. ? You hear the baby swallow after every 3 or 4 sucks. ? You see movement above and in front of the baby's ears while he or she is sucking.  Be aware of these signs that your baby has not latched on to your nipple: ? The baby makes sucking sounds or smacking sounds while feeding. ? You have nipple pain.  If your baby is not latched well, put your little finger between your baby's gums and your nipple. This will break the seal. Then try to help your baby latch again.  If you keep having problems, get help from a breastfeeding specialist (Advertising copywriter). Contact a doctor if:  You have cracking or soreness in your nipples that lasts longer than 1 week.  You have nipple pain.  Your breasts are filled with too much milk (engorgement), and this does not improve after 48-72 hours.  You have a plugged milk duct and a fever.  You follow the tips for a good latch but you keep having problems or concerns.  You have a pus-like fluid coming from your breast.  Your baby is not gaining weight.  Your baby loses weight. Summary  Latching is how your baby's mouth attaches to your nipple to breastfeed.  Try different positions for breastfeeding to find one that works best for you and your baby.  A poor latch may cause you to have cracked or sore nipples or other problems. This information is not intended to replace advice given to you by your health care provider. Make sure you discuss any questions you have with your health care provider. Document Released: 08/24/2016 Document Revised: 05/09/2018 Document Reviewed: 08/24/2016 Elsevier Patient Education  2020 [employer]. Breastfeeding  Choosing to breastfeed is one of the best decisions you can make for yourself and your baby. A change in hormones during pregnancy causes your breasts to make breast milk in your  milk-producing glands. Hormones prevent breast milk from being released before your baby is born. They also prompt milk flow after birth. Once breastfeeding has begun, thoughts of your baby, as well as his or her sucking or crying, can stimulate the release of milk from your milk-producing glands. Benefits of breastfeeding Research shows that breastfeeding offers many health benefits for infants and mothers. It also offers a cost-free and convenient way to feed your baby. For your baby  Your first milk (colostrum) helps your baby's digestive system to function better.  Special cells in your milk (antibodies) help your baby to fight off infections.  Breastfed babies are less likely to develop asthma, allergies, obesity, or type 2 diabetes. They are also at lower risk for sudden infant death syndrome (SIDS).  Nutrients in breast milk are better able to meet your babys needs compared to infant formula.  Breast milk improves your baby's brain development. For you  Breastfeeding helps to create a very special bond between you and your baby.  Breastfeeding is convenient. Breast milk costs nothing and is always available at the correct temperature.  Breastfeeding helps to burn calories. It helps you to lose the weight that you gained during pregnancy.  Breastfeeding makes your uterus return faster to its size before pregnancy. It also slows bleeding (lochia) after you give birth.  Breastfeeding helps to lower your risk of developing type 2 diabetes, osteoporosis, rheumatoid arthritis, cardiovascular disease, and breast, ovarian, uterine, and endometrial cancer later in life. Breastfeeding basics Starting breastfeeding  Find a comfortable place to sit or lie down, with your neck and back well-supported.  Place a pillow or a rolled-up blanket under your baby to bring him or her to the level of your breast (if you are seated). Nursing pillows are specially designed to help support your arms and  your baby while you breastfeed.  Make sure that your baby's tummy (abdomen) is facing your abdomen.  Gently massage your breast. With your fingertips, massage from the outer edges of your breast inward toward the nipple. This encourages milk flow. If your milk flows slowly, you may need to continue this action during the feeding.  Support your breast with 4 fingers underneath and your thumb above your nipple (make the letter "C" with  your hand). Make sure your fingers are well away from your nipple and your babys mouth.  Stroke your baby's lips gently with your finger or nipple.  When your baby's mouth is open wide enough, quickly bring your baby to your breast, placing your entire nipple and as much of the areola as possible into your baby's mouth. The areola is the colored area around your nipple. ? More areola should be visible above your baby's upper lip than below the lower lip. ? Your baby's lips should be opened and extended outward (flanged) to ensure an adequate, comfortable latch. ? Your baby's tongue should be between his or her lower gum and your breast.  Make sure that your baby's mouth is correctly positioned around your nipple (latched). Your baby's lips should create a seal on your breast and be turned out (everted).  It is common for your baby to suck about 2-3 minutes in order to start the flow of breast milk. Latching Teaching your baby how to latch onto your breast properly is very important. An improper latch can cause nipple pain, decreased milk supply, and poor weight gain in your baby. Also, if your baby is not latched onto your nipple properly, he or she may swallow some air during feeding. This can make your baby fussy. Burping your baby when you switch breasts during the feeding can help to get rid of the air. However, teaching your baby to latch on properly is still the best way to prevent fussiness from swallowing air while breastfeeding. Signs that your baby has  successfully latched onto your nipple  Silent tugging or silent sucking, without causing you pain. Infant's lips should be extended outward (flanged).  Swallowing heard between every 3-4 sucks once your milk has started to flow (after your let-down milk reflex occurs).  Muscle movement above and in front of his or her ears while sucking. Signs that your baby has not successfully latched onto your nipple  Sucking sounds or smacking sounds from your baby while breastfeeding.  Nipple pain. If you think your baby has not latched on correctly, slip your finger into the corner of your babys mouth to break the suction and place it between your baby's gums. Attempt to start breastfeeding again. Signs of successful breastfeeding Signs from your baby  Your baby will gradually decrease the number of sucks or will completely stop sucking.  Your baby will fall asleep.  Your baby's body will relax.  Your baby will retain a small amount of milk in his or her mouth.  Your baby will let go of your breast by himself or herself. Signs from you  Breasts that have increased in firmness, weight, and size 1-3 hours after feeding.  Breasts that are softer immediately after breastfeeding.  Increased milk volume, as well as a change in milk consistency and color by the fifth day of breastfeeding.  Nipples that are not sore, cracked, or bleeding. Signs that your baby is getting enough milk  Wetting at least 1-2 diapers during the first 24 hours after birth.  Wetting at least 5-6 diapers every 24 hours for the first week after birth. The urine should be clear or pale yellow by the age of 5 days.  Wetting 6-8 diapers every 24 hours as your baby continues to grow and develop.  At least 3 stools in a 24-hour period by the age of 5 days. The stool should be soft and yellow.  At least 3 stools in a 24-hour period by the  age of 7 days. The stool should be seedy and yellow.  No loss of weight greater  than 10% of birth weight during the first 3 days of life.  Average weight gain of 4-7 oz (113-198 g) per week after the age of 4 days.  Consistent daily weight gain by the age of 5 days, without weight loss after the age of 2 weeks. After a feeding, your baby may spit up a small amount of milk. This is normal. Breastfeeding frequency and duration Frequent feeding will help you make more milk and can prevent sore nipples and extremely full breasts (breast engorgement). Breastfeed when you feel the need to reduce the fullness of your breasts or when your baby shows signs of hunger. This is called "breastfeeding on demand." Signs that your baby is hungry include:  Increased alertness, activity, or restlessness.  Movement of the head from side to side.  Opening of the mouth when the corner of the mouth or cheek is stroked (rooting).  Increased sucking sounds, smacking lips, cooing, sighing, or squeaking.  Hand-to-mouth movements and sucking on fingers or hands.  Fussing or crying. Avoid introducing a pacifier to your baby in the first 4-6 weeks after your baby is born. After this time, you may choose to use a pacifier. Research has shown that pacifier use during the first year of a baby's life decreases the risk of sudden infant death syndrome (SIDS). Allow your baby to feed on each breast as long as he or she wants. When your baby unlatches or falls asleep while feeding from the first breast, offer the second breast. Because newborns are often sleepy in the first few weeks of life, you may need to awaken your baby to get him or her to feed. Breastfeeding times will vary from baby to baby. However, the following rules can serve as a guide to help you make sure that your baby is properly fed:  Newborns (babies 35 weeks of age or younger) may breastfeed every 1-3 hours.  Newborns should not go without breastfeeding for longer than 3 hours during the day or 5 hours during the night.  You should  breastfeed your baby a minimum of 8 times in a 24-hour period. Breast milk pumping     Pumping and storing breast milk allows you to make sure that your baby is exclusively fed your breast milk, even at times when you are unable to breastfeed. This is especially important if you go back to work while you are still breastfeeding, or if you are not able to be present during feedings. Your lactation consultant can help you find a method of pumping that works best for you and give you guidelines about how long it is safe to store breast milk. Caring for your breasts while you breastfeed Nipples can become dry, cracked, and sore while breastfeeding. The following recommendations can help keep your breasts moisturized and healthy:  Avoid using soap on your nipples.  Wear a supportive bra designed especially for nursing. Avoid wearing underwire-style bras or extremely tight bras (sports bras).  Air-dry your nipples for 3-4 minutes after each feeding.  Use only cotton bra pads to absorb leaked breast milk. Leaking of breast milk between feedings is normal.  Use lanolin on your nipples after breastfeeding. Lanolin helps to maintain your skin's normal moisture barrier. Pure lanolin is not harmful (not toxic) to your baby. You may also hand express a few drops of breast milk and gently massage that milk into your nipples  and allow the milk to air-dry. In the first few weeks after giving birth, some women experience breast engorgement. Engorgement can make your breasts feel heavy, warm, and tender to the touch. Engorgement peaks within 3-5 days after you give birth. The following recommendations can help to ease engorgement:  Completely empty your breasts while breastfeeding or pumping. You may want to start by applying warm, moist heat (in the shower or with warm, water-soaked hand towels) just before feeding or pumping. This increases circulation and helps the milk flow. If your baby does not completely  empty your breasts while breastfeeding, pump any extra milk after he or she is finished.  Apply ice packs to your breasts immediately after breastfeeding or pumping, unless this is too uncomfortable for you. To do this: ? Put ice in a plastic bag. ? Place a towel between your skin and the bag. ? Leave the ice on for 20 minutes, 2-3 times a day.  Make sure that your baby is latched on and positioned properly while breastfeeding. If engorgement persists after 48 hours of following these recommendations, contact your health care provider or a Advertising copywriter. Overall health care recommendations while breastfeeding  Eat 3 healthy meals and 3 snacks every day. Well-nourished mothers who are breastfeeding need an additional 450-500 calories a day. You can meet this requirement by increasing the amount of a balanced diet that you eat.  Drink enough water to keep your urine pale yellow or clear.  Rest often, relax, and continue to take your prenatal vitamins to prevent fatigue, stress, and low vitamin and mineral levels in your body (nutrient deficiencies).  Do not use any products that contain nicotine or tobacco, such as cigarettes and e-cigarettes. Your baby may be harmed by chemicals from cigarettes that pass into breast milk and exposure to secondhand smoke. If you need help quitting, ask your health care provider.  Avoid alcohol.  Do not use illegal drugs or marijuana.  Talk with your health care provider before taking any medicines. These include over-the-counter and prescription medicines as well as vitamins and herbal supplements. Some medicines that may be harmful to your baby can pass through breast milk.  It is possible to become pregnant while breastfeeding. If birth control is desired, ask your health care provider about options that will be safe while breastfeeding your baby. Where to find more information: [employer]: www.llli.org Contact a health care  provider if:  You feel like you want to stop breastfeeding or have become frustrated with breastfeeding.  Your nipples are cracked or bleeding.  Your breasts are red, tender, or warm.  You have: ? Painful breasts or nipples. ? A swollen area on either breast. ? A fever or chills. ? Nausea or vomiting. ? Drainage other than breast milk from your nipples.  Your breasts do not become full before feedings by the fifth day after you give birth.  You feel sad and depressed.  Your baby is: ? Too sleepy to eat well. ? Having trouble sleeping. ? More than 33 week old and wetting fewer than 6 diapers in a 24-hour period. ? Not gaining weight by 20 days of age.  Your baby has fewer than 3 stools in a 24-hour period.  Your baby's skin or the white parts of his or her eyes become yellow. Get help right away if:  Your baby is overly tired (lethargic) and does not want to wake up and feed.  Your baby develops an unexplained fever. Summary  Breastfeeding offers many health benefits for infant and mothers.  Try to breastfeed your infant when he or she shows early signs of hunger.  Gently tickle or stroke your baby's lips with your finger or nipple to allow the baby to open his or her mouth. Bring the baby to your breast. Make sure that much of the areola is in your baby's mouth. Offer one side and burp the baby before you offer the other side.  Talk with your health care provider or lactation consultant if you have questions or you face problems as you breastfeed. This information is not intended to replace advice given to you by your health care provider. Make sure you discuss any questions you have with your health care provider. Document Released: 01/17/2005 Document Revised: 04/13/2017 Document Reviewed: 02/19/2016 Elsevier Patient Education  2020 [employer] Care of a Perineal Tear A perineal tear is a cut or tear (laceration) in the tissue between the opening of the vagina and the  anus (perineum). Some women develop a perineal tear during a vaginal birth. This can happen as the baby emerges from the birth canal and the perineum is stretched. There are four degrees of perineal tears based on how deep and long the laceration is:  First degree. This involves a shallow tear at the edge of the vaginal opening that extends slightly into the perineal skin.  Second degree. This involves tearing described in first degree perineal tear, and an additional deeper tear of the vaginal opening and perineal tissues. It may also include tearing of a muscle just under the perineal skin.  Third degree. This involves tearing described in first and second degree perineal tears, with the addition that tearing in the third degree extends into the muscle of the anus (anal sphincter).  Fourth degree. This involves all levels of tears described in first, second, and third degree perineal tears, with the tear in the fourth degree extending into the rectum. First and second degree perineal tears may or may not be stitched closed, depending on their location and appearance. Third and fourth degree perineal tears are stitched closed immediately after the babys birth. What are the risks? Depending on the type of perineal tear you have, you may be at risk for:  Bleeding.  Developing a collection of blood in the perineal tear area (hematoma).  Pain. This may include pain when you urinate, or pain when you have a bowel movement.  Infection at the site of the tear.  Fever.  Trouble controlling your urination or bowels (incontinence).  Painful sex. How to care for a perineal tear Wound care  Take a sitz bath as told by your health care provider. A sitz bath is a warm water bath that is taken while you are sitting down. The water should only come up to your hips and should cover your buttocks. This can speed up healing. ? Partially fill a bathtub with warm water. You will only need the water to be  deep enough to cover your hips and buttocks when you are sitting in it. ? If your health care provider told you to put medicine in the water, follow the directions exactly as told. ? Sit in the water and open the tub drain a little. ? Turn on the warm water again to keep the tub at the correct level. Keep the water running constantly. ? Soak in the water for 15-20 minutes or as told by your health care provider. ? After the sitz bath, pat the  affected area dry first. Do not rub it. ? Be careful when you stand up after the sitz bath because you may feel dizzy.  Wash your hands before and after applying medicine to the area.  Wear a sanitary pad as told by your health care provider. Change the pad as often as told by your health care provider.  Leave stitches (sutures), skin glue, or adhesive strips in place. These skin closures may need to stay in place for 2 weeks or longer. If adhesive strip edges start to loosen and curl up, you may trim the loose edges. Do not remove adhesive strips completely unless your health care provider tells you to do that.  Check your wound every day for signs of infection. Check for: ? Redness, swelling, or pain. ? Fluid or blood. ? Warmth. ? Pus or a bad smell. Managing pain  If directed, put ice on the painful area: ? Put ice in a plastic bag. ? Place a towel between your skin and the bag. ? Leave the ice on for 20 minutes, 2-3 times a day.  Apply a numbing spray to the perineal tear site as told by your health care provider. This may help with discomfort.  Take and apply over-the-counter and prescription medicines only as told by your health care provider.  If told, put about 3 witch hazel-containing hemorrhoid treatment pads on top of your sanitary pad. The witch hazel in the hemorrhoid pads helps with swelling and discomfort.  Sit on an inflatable ring or pillow. This may provide comfort. General instructions  Squeeze warm water on your perineum  after urinating. This should be done from front to back with a squeeze bottle. Pat the area to dry it.  Do not have sex, use tampons, or place anything in your vagina for at least 6 weeks or as told by your health care provider.  Keep all follow-up visits as told by your health care provider. These include any postpartum visits. This is important. Contact a health care provider if:  Your pain is not relieved with medicines.  You have painful urination.  You have redness, swelling, or pain around your tear.  You have fluid or blood coming from your tear.  Your tear feels warm to the touch.  You have pus or a bad smell coming from your tear.  You have a fever. Get help right away if:  Your tear opens.  You cannot urinate.  You have an increase in bleeding.  You have severe pain. Summary  A perineal tear is a cut or tear (laceration) in the tissue between the opening of the vagina and the anus (perineum).  There are four degrees of perineal tears based on how deep and long the laceration is.  First and second-degree perineal tears may or may not be stitched closed, depending on their location and appearance. Third and fourth- degree perineal tears are stitched closed immediately after the babys birth.  Follow your health care provider's instructions for caring for your perineal tear. Know how to manage pain and how to care for your wound. Know when to call your health care provider and when to seek immediate emergency care. This information is not intended to replace advice given to you by your health care provider. Make sure you discuss any questions you have with your health care provider. Document Released: 06/03/2013 Document Revised: 12/30/2016 Document Reviewed: 02/22/2016 Elsevier Patient Education  2020 [employer]. Postpartum Care After Vaginal Delivery This sheet gives you information about  how to care for yourself from the time you deliver your baby to up to 6-12  weeks after delivery (postpartum period). Your health care provider may also give you more specific instructions. If you have problems or questions, contact your health care provider. Follow these instructions at home: Vaginal bleeding  It is normal to have vaginal bleeding (lochia) after delivery. Wear a sanitary pad for vaginal bleeding and discharge. ? During the first week after delivery, the amount and appearance of lochia is often similar to a menstrual period. ? Over the next few weeks, it will gradually decrease to a dry, yellow-brown discharge. ? For most women, lochia stops completely by 4-6 weeks after delivery. Vaginal bleeding can vary from woman to woman.  Change your sanitary pads frequently. Watch for any changes in your flow, such as: ? A sudden increase in volume. ? A change in color. ? Large blood clots.  If you pass a blood clot from your vagina, save it and call your health care provider to discuss. Do not flush blood clots down the toilet before talking with your health care provider.  Do not use tampons or douches until your health care provider says this is safe.  If you are not breastfeeding, your period should return 6-8 weeks after delivery. If you are feeding your child breast milk only (exclusive breastfeeding), your period may not return until you stop breastfeeding. Perineal care  Keep the area between the vagina and the anus (perineum) clean and dry as told by your health care provider. Use medicated pads and pain-relieving sprays and creams as directed.  If you had a cut in the perineum (episiotomy) or a tear in the vagina, check the area for signs of infection until you are healed. Check for: ? More redness, swelling, or pain. ? Fluid or blood coming from the cut or tear. ? Warmth. ? Pus or a bad smell.  You may be given a squirt bottle to use instead of wiping to clean the perineum area after you go to the bathroom. As you start healing, you may use the  squirt bottle before wiping yourself. Make sure to wipe gently.  To relieve pain caused by an episiotomy, a tear in the vagina, or swollen veins in the anus (hemorrhoids), try taking a warm sitz bath 2-3 times a day. A sitz bath is a warm water bath that is taken while you are sitting down. The water should only come up to your hips and should cover your buttocks. Breast care  Within the first few days after delivery, your breasts may feel heavy, full, and uncomfortable (breast engorgement). Milk may also leak from your breasts. Your health care provider can suggest ways to help relieve the discomfort. Breast engorgement should go away within a few days.  If you are breastfeeding: ? Wear a bra that supports your breasts and fits you well. ? Keep your nipples clean and dry. Apply creams and ointments as told by your health care provider. ? You may need to use breast pads to absorb milk that leaks from your breasts. ? You may have uterine contractions every time you breastfeed for up to several weeks after delivery. Uterine contractions help your uterus return to its normal size. ? If you have any problems with breastfeeding, work with your health care provider or [employer].  If you are not breastfeeding: ? Avoid touching your breasts a lot. Doing this can make your breasts produce more milk. ? Wear a good-fitting bra  and use cold packs to help with swelling. ? Do not squeeze out (express) milk. This causes you to make more milk. Intimacy and sexuality  Ask your health care provider when you can engage in sexual activity. This may depend on: ? Your risk of infection. ? How fast you are healing. ? Your comfort and desire to engage in sexual activity.  You are able to get pregnant after delivery, even if you have not had your period. If desired, talk with your health care provider about methods of birth control (contraception). Medicines  Take over-the-counter and prescription  medicines only as told by your health care provider.  If you were prescribed an antibiotic medicine, take it as told by your health care provider. Do not stop taking the antibiotic even if you start to feel better. Activity  Gradually return to your normal activities as told by your health care provider. Ask your health care provider what activities are safe for you.  Rest as much as possible. Try to rest or take a nap while your baby is sleeping. Eating and drinking   Drink enough fluid to keep your urine pale yellow.  Eat high-fiber foods every day. These may help prevent or relieve constipation. High-fiber foods include: ? Whole grain cereals and breads. ? Brown rice. ? Beans. ? Fresh fruits and vegetables.  Do not try to lose weight quickly by cutting back on calories.  Take your prenatal vitamins until your postpartum checkup or until your health care provider tells you it is okay to stop. Lifestyle  Do not use any products that contain nicotine or tobacco, such as cigarettes and e-cigarettes. If you need help quitting, ask your health care provider.  Do not drink alcohol, especially if you are breastfeeding. General instructions  Keep all follow-up visits for you and your baby as told by your health care provider. Most women visit their health care provider for a postpartum checkup within the first 3-6 weeks after delivery. Contact a health care provider if:  You feel unable to cope with the changes that your child brings to your life, and these feelings do not go away.  You feel unusually sad or worried.  Your breasts become red, painful, or hard.  You have a fever.  You have trouble holding urine or keeping urine from leaking.  You have little or no interest in activities you used to enjoy.  You have not breastfed at all and you have not had a menstrual period for 12 weeks after delivery.  You have stopped breastfeeding and you have not had a menstrual period for  12 weeks after you stopped breastfeeding.  You have questions about caring for yourself or your baby.  You pass a blood clot from your vagina. Get help right away if:  You have chest pain.  You have difficulty breathing.  You have sudden, severe leg pain.  You have severe pain or cramping in your lower abdomen.  You bleed from your vagina so much that you fill more than one sanitary pad in one hour. Bleeding should not be heavier than your heaviest period.  You develop a severe headache.  You faint.  You have blurred vision or spots in your vision.  You have bad-smelling vaginal discharge.  You have thoughts about hurting yourself or your baby. If you ever feel like you may hurt yourself or others, or have thoughts about taking your own life, get help right away. You can go to the nearest emergency department  or call:  Your local emergency services (911 in the [place]  A suicide crisis helpline, such as the National Suicide Prevention Lifeline at 601-430-4390. This is open 24 hours a day. Summary  The period of time right after you deliver your newborn up to 6-12 weeks after delivery is called the postpartum period.  Gradually return to your normal activities as told by your health care provider.  Keep all follow-up visits for you and your baby as told by your health care provider. This information is not intended to replace advice given to you by your health care provider. Make sure you discuss any questions you have with your health care provider. Document Released: 11/14/2006 Document Revised: 01/20/2017 Document Reviewed: 10/31/2016 Elsevier Patient Education  2020 [employer]. Postpartum Baby Blues The postpartum period begins right after the birth of a baby. During this time, there is often a lot of joy and excitement. It is also a time of many changes in the life of the parents. No matter how many times a mother gives birth, each child brings new challenges to the  family, including different ways of relating to one another. It is common to have feelings of excitement along with confusing changes in moods, emotions, and thoughts. You may feel happy one minute and sad or stressed the next. These feelings of sadness usually happen in the period right after you have your baby, and they go away within a week or two. This is called the "baby blues." What are the causes? There is no known cause of baby blues. It is likely caused by a combination of factors. However, changes in hormone levels after childbirth are believed to trigger some of the symptoms. Other factors that can play a role in these mood changes include:  Lack of sleep.  Stressful life events, such as poverty, caring for a loved one, or death of a loved one.  Genetics. What are the signs or symptoms? Symptoms of this condition include:  Brief changes in mood, such as going from extreme happiness to sadness.  Decreased concentration.  Difficulty sleeping.  Crying spells and tearfulness.  Loss of appetite.  Irritability.  Anxiety. If the symptoms of baby blues last for more than 2 weeks or become more severe, you may have postpartum depression. How is this diagnosed? This condition is diagnosed based on an evaluation of your symptoms. There are no medical or lab tests that lead to a diagnosis, but there are various questionnaires that a health care provider may use to identify women with the baby blues or postpartum depression. How is this treated? Treatment is not needed for this condition. The baby blues usually go away on their own in 1-2 weeks. Social support is often all that is needed. You will be encouraged to get adequate sleep and rest. Follow these instructions at home: Lifestyle      Get as much rest as you can. Take a nap when the baby sleeps.  Exercise regularly as told by your health care provider. Some women find yoga and walking to be helpful.  Eat a balanced and  nourishing diet. This includes plenty of fruits and vegetables, whole grains, and lean proteins.  Do little things that you enjoy. Have a cup of tea, take a bubble bath, read your favorite magazine, or listen to your favorite music.  Avoid alcohol.  Ask for help with household chores, cooking, grocery shopping, or running errands. Do not try to do everything yourself. Consider hiring a postpartum doula  to help. This is a professional who specializes in providing support to new mothers.  Try not to make any major life changes during pregnancy or right after giving birth. This can add stress. General instructions  Talk to people close to you about how you are feeling. Get support from your partner, family members, friends, or other new moms. You may want to join a support group.  Find ways to cope with stress. This may include: ? Writing your thoughts and feelings in a journal. ? Spending time outside. ? Spending time with people who make you laugh.  Try to stay positive in how you think. Think about the things you are grateful for.  Take over-the-counter and prescription medicines only as told by your health care provider.  Let your health care provider know if you have any concerns.  Keep all postpartum visits as told by your health care provider. This is important. Contact a health care provider if:  Your baby blues do not go away after 2 weeks. Get help right away if:  You have thoughts of taking your own life (suicidal thoughts).  You think you may harm the baby or other people.  You see or hear things that are not there (hallucinations). Summary  After giving birth, you may feel happy one minute and sad or stressed the next. Feelings of sadness that happen right after the baby is born and go away after a week or two are called the "baby blues."  You can manage the baby blues by getting enough rest, eating a healthy diet, exercising, spending time with supportive people, and  finding ways to cope with stress.  If feelings of sadness and stress last longer than 2 weeks or get in the way of caring for your baby, talk to your health care provider. This may mean you have postpartum depression. This information is not intended to replace advice given to you by your health care provider. Make sure you discuss any questions you have with your health care provider. Document Released: 10/22/2003 Document Revised: 05/11/2018 Document Reviewed: 03/15/2016 Elsevier Patient Education  2020 [employer].

## 2018-10-30 NOTE — Progress Notes (Signed)
Patient discharged home with infant. Discharge instructions and prescriptions given and reviewed with patient. Patient verbalized understanding. Escorted out by staff.  

## 2018-11-01 ENCOUNTER — Other Ambulatory Visit: Payer: Managed Care, Other (non HMO)

## 2018-11-01 ENCOUNTER — Encounter: Payer: Managed Care, Other (non HMO) | Admitting: Certified Nurse Midwife

## 2018-12-04 ENCOUNTER — Encounter: Payer: Managed Care, Other (non HMO) | Admitting: Certified Nurse Midwife

## 2018-12-06 ENCOUNTER — Ambulatory Visit (INDEPENDENT_AMBULATORY_CARE_PROVIDER_SITE_OTHER): Payer: Self-pay | Admitting: Certified Nurse Midwife

## 2018-12-06 ENCOUNTER — Other Ambulatory Visit: Payer: Self-pay

## 2018-12-06 ENCOUNTER — Encounter: Payer: Self-pay | Admitting: Certified Nurse Midwife

## 2018-12-06 NOTE — Progress Notes (Signed)
Patient here for post-partum check, no complaints.  

## 2018-12-06 NOTE — Progress Notes (Signed)
Subjective:    Tammie Burke is a 28 y.o. G28P1102 Black female who presents for a postpartum visit. She is 6 weeks postpartum following a vaginal birth after cesarean (VBAC) at 37+4 gestational weeks. Anesthesia: none. I have fully reviewed the prenatal and intrapartum course.   Postpartum course has been uncomplicated. Baby's course has been uncomplicated. Baby is feeding by breast.   Bleeding no bleeding. Bowel function is normal. Bladder function is normal.   Patient is not sexually active. Contraception method is abstinence; desires Nexplanon insertion.   Postpartum depression screening: negative. Score 0.    Last pap 09/20/2016 and was AS-CUS/HPV negative.  Denies difficulty breathing or respiratory distress, chest pain, abdominal pain, excessive vaginal bleeding, dysuria, and leg pain or swelling.   The following portions of the patient's history were reviewed and updated as appropriate: allergies, current medications, past medical history, past surgical history and problem list.  Review of Systems  Pertinent items are noted in HPI.   Objective:   BP 116/88   Pulse 83   Ht 5\' 7"  (1.702 m)   Wt 134 lb 3.2 oz (60.9 kg)   Breastfeeding Yes   BMI 21.02 kg/m   General:  alert, cooperative and no distress   Breasts:  deferred, no complaints  Lungs: clear to auscultation bilaterally  Heart:  regular rate and rhythm  Abdomen: soft, nontender   Vulva: normal  Vagina: normal vagina  Cervix:  closed  Corpus: Well-involuted  Adnexa:  Non-palpable     Depression screen [facility] 2/9 12/06/2018  Decreased Interest 0  Down, Depressed, Hopeless 0  PHQ - 2 Score 0  Altered sleeping 0  Tired, decreased energy 0  Change in appetite 0  Feeling bad or failure about yourself  0  Trouble concentrating 0  Moving slowly or fidgety/restless 0  Suicidal thoughts 0  PHQ-9 Score 0     Assessment:   Postpartum exam Six (6) wks s/p successful vaginal birth after cesarean  section Breastfeeding Depression screening Contraception counseling   Plan:   May return to work without restriction.   Encouraged routine health maintenance techniques.   Reviewed red flag symptoms and when to call.   RTC x 2-3 weeks for Nexplanon insertion.   Follow up in: 5 months for ANNUAL EXAM and PAP or earlier if needed.   Diona Fanti, CNM [facility], [facility] 12/06/18 3:04 PM

## 2018-12-06 NOTE — Patient Instructions (Signed)
Etonogestrel implant What is this medicine? ETONOGESTREL (et oh noe JES trel) is a contraceptive (birth control) device. It is used to prevent pregnancy. It can be used for up to 3 years. This medicine may be used for other purposes; ask your health care provider or pharmacist if you have questions. COMMON BRAND NAME(S): Implanon, Nexplanon What should I tell my health care provider before I take this medicine? They need to know if you have any of these conditions:  abnormal vaginal bleeding  blood vessel disease or blood clots  breast, cervical, endometrial, ovarian, liver, or uterine cancer  diabetes  gallbladder disease  heart disease or recent heart attack  high blood pressure  high cholesterol or triglycerides  kidney disease  liver disease  migraine headaches  seizures  stroke  tobacco smoker  an unusual or allergic reaction to etonogestrel, anesthetics or antiseptics, other medicines, foods, dyes, or preservatives  pregnant or trying to get pregnant  breast-feeding How should I use this medicine? This device is inserted just under the skin on the inner side of your upper arm by a health care professional. Talk to your pediatrician regarding the use of this medicine in children. Special care may be needed. Overdosage: If you think you have taken too much of this medicine contact a poison control center or emergency room at once. NOTE: This medicine is only for you. Do not share this medicine with others. What if I miss a dose? This does not apply. What may interact with this medicine? Do not take this medicine with any of the following medications:  amprenavir  fosamprenavir This medicine may also interact with the following medications:  acitretin  aprepitant  armodafinil  bexarotene  bosentan  carbamazepine  certain medicines for fungal infections like fluconazole, ketoconazole, itraconazole and voriconazole  certain medicines to treat  hepatitis, HIV or AIDS  cyclosporine  felbamate  griseofulvin  lamotrigine  modafinil  oxcarbazepine  phenobarbital  phenytoin  primidone  rifabutin  rifampin  rifapentine  St. John's wort  topiramate This list may not describe all possible interactions. Give your health care provider a list of all the medicines, herbs, non-prescription drugs, or dietary supplements you use. Also tell them if you smoke, drink alcohol, or use illegal drugs. Some items may interact with your medicine. What should I watch for while using this medicine? This product does not protect you against HIV infection (AIDS) or other sexually transmitted diseases. You should be able to feel the implant by pressing your fingertips over the skin where it was inserted. Contact your doctor if you cannot feel the implant, and use a non-hormonal birth control method (such as condoms) until your doctor confirms that the implant is in place. Contact your doctor if you think that the implant may have broken or become bent while in your arm. You will receive a user card from your health care provider after the implant is inserted. The card is a record of the location of the implant in your upper arm and when it should be removed. Keep this card with your health records. What side effects may I notice from receiving this medicine? Side effects that you should report to your doctor or health care professional as soon as possible:  allergic reactions like skin rash, itching or hives, swelling of the face, lips, or tongue  breast lumps, breast tissue changes, or discharge  breathing problems  changes in emotions or moods  if you feel that the implant may have broken or   bent while in your arm  high blood pressure  pain, irritation, swelling, or bruising at the insertion site  scar at site of insertion  signs of infection at the insertion site such as fever, and skin redness, pain or discharge  signs and  symptoms of a blood clot such as breathing problems; changes in vision; chest pain; severe, sudden headache; pain, swelling, warmth in the leg; trouble speaking; sudden numbness or weakness of the face, arm or leg  signs and symptoms of liver injury like dark yellow or brown urine; general ill feeling or flu-like symptoms; light-colored stools; loss of appetite; nausea; right upper belly pain; unusually weak or tired; yellowing of the eyes or skin  unusual vaginal bleeding, discharge Side effects that usually do not require medical attention (report to your doctor or health care professional if they continue or are bothersome):  acne  breast pain or tenderness  headache  irregular menstrual bleeding  nausea This list may not describe all possible side effects. Call your doctor for medical advice about side effects. You may report side effects to FDA at 1-800-FDA-1088. Where should I keep my medicine? This drug is given in a hospital or clinic and will not be stored at home. NOTE: This sheet is a summary. It may not cover all possible information. If you have questions about this medicine, talk to your doctor, pharmacist, or health care provider.  2020 Elsevier/Gold Standard (2016-12-06 14:11:42) Preventive Care 21-39 Years Old, Female Preventive care refers to visits with your health care provider and lifestyle choices that can promote health and wellness. This includes:  A yearly physical exam. This may also be called an annual well check.  Regular dental visits and eye exams.  Immunizations.  Screening for certain conditions.  Healthy lifestyle choices, such as eating a healthy diet, getting regular exercise, not using drugs or products that contain nicotine and tobacco, and limiting alcohol use. What can I expect for my preventive care visit? Physical exam Your health care provider will check your:  Height and weight. This may be used to calculate body mass index (BMI), which  tells if you are at a healthy weight.  Heart rate and blood pressure.  Skin for abnormal spots. Counseling Your health care provider may ask you questions about your:  Alcohol, tobacco, and drug use.  Emotional well-being.  Home and relationship well-being.  Sexual activity.  Eating habits.  Work and work environment.  Method of birth control.  Menstrual cycle.  Pregnancy history. What immunizations do I need?  Influenza (flu) vaccine  This is recommended every year. Tetanus, diphtheria, and pertussis (Tdap) vaccine  You may need a Td booster every 10 years. Varicella (chickenpox) vaccine  You may need this if you have not been vaccinated. Human papillomavirus (HPV) vaccine  If recommended by your health care provider, you may need three doses over 6 months. Measles, mumps, and rubella (MMR) vaccine  You may need at least one dose of MMR. You may also need a second dose. Meningococcal conjugate (MenACWY) vaccine  One dose is recommended if you are age 19-21 years and a first-year college student living in a residence hall, or if you have one of several medical conditions. You may also need additional booster doses. Pneumococcal conjugate (PCV13) vaccine  You may need this if you have certain conditions and were not previously vaccinated. Pneumococcal polysaccharide (PPSV23) vaccine  You may need one or two doses if you smoke cigarettes or if you have certain conditions. Hepatitis   A vaccine  You may need this if you have certain conditions or if you travel or work in places where you may be exposed to hepatitis A. Hepatitis B vaccine  You may need this if you have certain conditions or if you travel or work in places where you may be exposed to hepatitis B. Haemophilus influenzae type b (Hib) vaccine  You may need this if you have certain conditions. You may receive vaccines as individual doses or as more than one vaccine together in one shot (combination  vaccines). Talk with your health care provider about the risks and benefits of combination vaccines. What tests do I need?  Blood tests  Lipid and cholesterol levels. These may be checked every 5 years starting at age 20.  Hepatitis C test.  Hepatitis B test. Screening  Diabetes screening. This is done by checking your blood sugar (glucose) after you have not eaten for a while (fasting).  Sexually transmitted disease (STD) testing.  BRCA-related cancer screening. This may be done if you have a family history of breast, ovarian, tubal, or peritoneal cancers.  Pelvic exam and Pap test. This may be done every 3 years starting at age 21. Starting at age 30, this may be done every 5 years if you have a Pap test in combination with an HPV test. Talk with your health care provider about your test results, treatment options, and if necessary, the need for more tests. Follow these instructions at home: Eating and drinking   Eat a diet that includes fresh fruits and vegetables, whole grains, lean protein, and low-fat dairy.  Take vitamin and mineral supplements as recommended by your health care provider.  Do not drink alcohol if: ? Your health care provider tells you not to drink. ? You are pregnant, may be pregnant, or are planning to become pregnant.  If you drink alcohol: ? Limit how much you have to 0-1 drink a day. ? Be aware of how much alcohol is in your drink. In the [place] one drink equals one 12 oz bottle of beer (355 mL), one 5 oz glass of wine (148 mL), or one 1 oz glass of hard liquor (44 mL). Lifestyle  Take daily care of your teeth and gums.  Stay active. Exercise for at least 30 minutes on 5 or more days each week.  Do not use any products that contain nicotine or tobacco, such as cigarettes, e-cigarettes, and chewing tobacco. If you need help quitting, ask your health care provider.  If you are sexually active, practice safe sex. Use a condom or other form of birth  control (contraception) in order to prevent pregnancy and STIs (sexually transmitted infections). If you plan to become pregnant, see your health care provider for a preconception visit. What's next?  Visit your health care provider once a year for a well check visit.  Ask your health care provider how often you should have your eyes and teeth checked.  Stay up to date on all vaccines. This information is not intended to replace advice given to you by your health care provider. Make sure you discuss any questions you have with your health care provider. Document Released: 03/15/2001 Document Revised: 09/28/2017 Document Reviewed: 09/28/2017 Elsevier Patient Education  2020 Elsevier Inc.  

## 2018-12-19 NOTE — Progress Notes (Signed)
Tammie Burke is a 28 y.o. year old African female here for Nexplanon insertion.    Risks/benefits/side effects of Nexplanon have been discussed and her questions have been answered.  Specifically, a failure rate of 01/998 has been reported, with an increased failure rate if pt takes [employer] and/or antiseizure medicaitons.  [name] is aware of the common side effect of irregular bleeding, which the incidence of decreases over time.  BP 106/67   Pulse 73   Ht 5\' 7"  (1.702 m)   Wt 137 lb 4.8 oz (62.3 kg)   LMP  (LMP Unknown)   Breastfeeding Yes   BMI 21.50 kg/m   She is right-handed, so her left arm, approximately 4 inches proximal from the elbow, was cleansed with alcohol and anesthetized with 2cc of 2% Lidocaine.  The area was cleansed again with betadine and the Nexplanon was inserted per manufacturer's recommendations without difficulty.  A steri-strip and pressure bandage were applied.  Pt was instructed to keep the area clean and dry, remove pressure bandage in 24 hours, and keep insertion site covered with the steri-strip for 3-5 days.    Back up contraception was recommended for 2 weeks.    She was given a card indicating date Nexplanon was inserted and date it needs to be removed.   Reviewed red flag symptoms and when to call.   RTC x 9 months for ANNUAL EXAM and Pap or sooner if needed.    Diona Fanti, CNM [facility], [place] 12/21/18 5:04 PM   NDC: 2979-8921-19 Lot: [id number] Exp: 12/28/2020   Diona Fanti, CNM [facility], [facility] 12/21/18 4:26 PM

## 2018-12-21 ENCOUNTER — Other Ambulatory Visit: Payer: Self-pay

## 2018-12-21 ENCOUNTER — Ambulatory Visit (INDEPENDENT_AMBULATORY_CARE_PROVIDER_SITE_OTHER): Payer: Managed Care, Other (non HMO) | Admitting: Certified Nurse Midwife

## 2018-12-21 ENCOUNTER — Encounter: Payer: Self-pay | Admitting: Certified Nurse Midwife

## 2018-12-21 VITALS — BP 106/67 | HR 73 | Ht 67.0 in | Wt 137.3 lb

## 2018-12-21 DIAGNOSIS — Z30017 Encounter for initial prescription of implantable subdermal contraceptive: Secondary | ICD-10-CM | POA: Diagnosis not present

## 2018-12-21 DIAGNOSIS — Z975 Presence of (intrauterine) contraceptive device: Secondary | ICD-10-CM | POA: Insufficient documentation

## 2018-12-21 NOTE — Patient Instructions (Signed)

## 2018-12-21 NOTE — Progress Notes (Signed)
Patient here for Nexplanon insertion, no complaints.  

## 2019-01-30 ENCOUNTER — Telehealth: Payer: Self-pay

## 2019-01-30 NOTE — Telephone Encounter (Signed)
Unknown--I would ask pediatrician.

## 2019-01-30 NOTE — Telephone Encounter (Signed)
Please advise 

## 2019-01-30 NOTE — Telephone Encounter (Signed)
Copied from [place] [id number]. Topic: General - Call Back - No Documentation >> Jan 30, 2019 11:27 AM Erick Blinks wrote: Best contact: [phone] Pt wants to know is safe for a mother that is nursing to receive vaccine. Please advise

## 2019-01-30 NOTE — Telephone Encounter (Signed)
Attempted to contact patient to advise of Dr. Marlan Palau recommendations. No answer and VM is not set up. Ok for [facility] to give message to patient.

## 2019-02-05 ENCOUNTER — Telehealth: Payer: Self-pay | Admitting: Family Medicine

## 2019-02-05 ENCOUNTER — Telehealth: Payer: Self-pay | Admitting: Certified Nurse Midwife

## 2019-02-05 NOTE — Telephone Encounter (Signed)
Pt is Tammie Burke's but Tammie Burke is out of the off. The pt is requesting a call pt is beast feeding and wants to know if it okay to do the covid vaccine. please advise

## 2019-02-11 NOTE — Telephone Encounter (Signed)
error 

## 2019-02-15 ENCOUNTER — Encounter: Payer: Self-pay | Admitting: Family Medicine

## 2019-02-15 ENCOUNTER — Ambulatory Visit (INDEPENDENT_AMBULATORY_CARE_PROVIDER_SITE_OTHER): Payer: Managed Care, Other (non HMO) | Admitting: Family Medicine

## 2019-02-15 ENCOUNTER — Other Ambulatory Visit: Payer: Self-pay

## 2019-02-15 VITALS — BP 105/72 | HR 83 | Temp 95.5°F | Ht 67.0 in | Wt 139.2 lb

## 2019-02-15 DIAGNOSIS — K219 Gastro-esophageal reflux disease without esophagitis: Secondary | ICD-10-CM | POA: Diagnosis not present

## 2019-02-15 MED ORDER — OMEPRAZOLE 20 MG PO CPDR: 20.0000 mg | DELAYED_RELEASE_CAPSULE | Freq: Every day | ORAL | 3 refills | Status: DC

## 2019-02-15 NOTE — Patient Instructions (Signed)
Helicobacter Pylori Infection Helicobacter pylori infection is a bacterial infection in the stomach. Long-term (chronic) infection can cause stomach irritation (gastritis), ulcers in the stomach (gastric ulcers), and ulcers in the upper part of the intestine (duodenal ulcers). Having this infection may also increase your risk of stomach cancer and a type of white blood cell cancer (lymphoma) that affects the stomach. What are the causes? This infection is caused by the Helicobacter pylori (H. pylori) bacteria. Many healthy people have this bacteria in their stomach lining. The bacteria may also spread from person to person through contact with stool (feces) or saliva. It is not known why some people develop ulcers, gastritis, or cancer from the bacteria. What increases the risk? You are more likely to develop this condition if you:  Have family members with the infection.  Live with many other people, such as in a dormitory.  Are of African, Hispanic, or Asian descent. What are the signs or symptoms? Most people with this infection do not have any symptoms. If you do have symptoms, they may include:  Heartburn.  Stomach pain.  Nausea.  Vomiting. The vomit may be bloody because of ulcers.  Loss of appetite.  Bad breath. How is this diagnosed? This condition may be diagnosed based on:  Your symptoms and medical history.  A physical exam.  Blood tests.  Stool tests.  A breath test.  A procedure that involves placing a tube with a camera on the end of it down your throat to examine your stomach and upper intestine (upper endoscopy).  Removing and testing a tissue sample from the stomach lining (biopsy). A biopsy may be taken during an upper endoscopy. How is this treated?  This condition is treated by taking a combination of medicines (triple therapy) for several weeks. Triple therapy includes one medicine to reduce the amount of acid in your stomach and two types of  antibiotic medicines. This treatment may reduce your risk of cancer. You may need to be tested for H. pylori again after treatment. In some cases, the treatment may need to be repeated if your treatment did not get rid of all the bacteria. Follow these instructions at home:   Take over-the-counter and prescription medicines only as told by your health care provider.  Take your antibiotics as told by your health care provider. Do not stop taking the antibiotics even if you start to feel better.  Return to your normal activities as told by your health care provider. Ask your health care provider what activities are safe for you.  Take steps to prevent future infections: ? Wash your hands often with soap and water. If soap and water are not available, use hand sanitizer. ? Do not eat food or drink water that may have had contact with stool or saliva.  Keep all follow-up visits as told by your health care provider. This is important. You may need tests to make sure your treatment worked. Contact a health care provider if your symptoms:  Do not get better with treatment.  Return after treatment. Summary  Helicobacter pylori infection is a stomach infection caused by the Helicobacter pylori (H. pylori) bacteria.  This infection can cause stomach irritation (gastritis), ulcers in the stomach (gastric ulcers), and ulcers in the upper part of the intestine (duodenal ulcers).  This condition is treated by taking a combination of medicines (triple therapy) for several weeks.  Take your antibiotics as told by your health care provider. Do not stop taking the antibiotics even if   you start to feel better. This information is not intended to replace advice given to you by your health care provider. Make sure you discuss any questions you have with your health care provider. Document Revised: 05/10/2018 Document Reviewed: 01/10/2017 Elsevier Patient Education  2020 Elsevier Inc. Gastritis,  Adult Gastritis is inflammation of the stomach. There are two kinds of gastritis:  Acute gastritis. This kind develops suddenly.  Chronic gastritis. This kind is much more common and lasts for a long time. Gastritis happens when the lining of the stomach becomes weak or gets damaged. Without treatment, gastritis can lead to stomach bleeding and ulcers. What are the causes? This condition may be caused by:  An infection.  Drinking too much alcohol.  Certain medicines. These include steroids, antibiotics, and some over-the-counter medicines, such as aspirin or ibuprofen.  Having too much acid in the stomach.  A disease of the intestines or stomach.  Stress.  An allergic reaction.  Crohn's disease.  Some cancer treatments (radiation). Sometimes the cause of this condition is not known. What are the signs or symptoms? Symptoms of this condition include:  Pain or a burning sensation in the upper abdomen.  Nausea.  Vomiting.  An uncomfortable feeling of fullness after eating.  Weight loss.  Bad breath.  Blood in your vomit or stools. In some cases, there are no symptoms. How is this diagnosed? This condition may be diagnosed with:  Your medical history and a description of your symptoms.  A physical exam.  Tests. These can include: ? Blood tests. ? Stool tests. ? A test in which a thin, flexible instrument with a light and a camera is passed down the esophagus and into the stomach (upper endoscopy). ? A test in which a sample of tissue is taken for testing (biopsy). How is this treated? This condition may be treated with medicines. The medicines that are used vary depending on the cause of the gastritis:  If the condition is caused by a bacterial infection, you may be given antibiotic medicines.  If the condition is caused by too much acid in the stomach, you may be given medicines called H2 blockers, proton pump inhibitors, or antacids. Treatment may also  involve stopping the use of certain medicines, such as aspirin, ibuprofen, or other NSAIDs. Follow these instructions at home: Medicines  Take over-the-counter and prescription medicines only as told by your health care provider.  If you were prescribed an antibiotic medicine, take it as told by your health care provider. Do not stop taking the antibiotic even if you start to feel better. Eating and drinking   Eat small, frequent meals instead of large meals.  Avoid foods and drinks that make your symptoms worse.  Drink enough fluid to keep your urine pale yellow. Alcohol use  Do not drink alcohol if: ? Your health care provider tells you not to drink. ? You are pregnant, may be pregnant, or are planning to become pregnant.  If you drink alcohol: ? Limit your use to:  0-1 drink a day for women.  0-2 drinks a day for men. ? Be aware of how much alcohol is in your drink. In the [place] one drink equals one 12 oz bottle of beer (355 mL), one 5 oz glass of wine (148 mL), or one 1 oz glass of hard liquor (44 mL). General instructions  Talk with your health care provider about ways to manage stress, such as getting regular exercise or practicing deep breathing, meditation, or yoga.  Do not use any products that contain nicotine or tobacco, such as cigarettes and e-cigarettes. If you need help quitting, ask your health care provider.  Keep all follow-up visits as told by your health care provider. This is important. Contact a health care provider if:  Your symptoms get worse.  Your symptoms return after treatment. Get help right away if:  You vomit blood or material that looks like coffee grounds.  You have black or dark red stools.  You are unable to keep fluids down.  Your abdominal pain gets worse.  You have a fever.  You do not feel better after one week. Summary  Gastritis is inflammation of the lining of the stomach that can occur suddenly (acute) or develop  slowly over time (chronic).  This condition is diagnosed with a medical history, a physical exam, or tests.  This condition may be treated with medicines to treat infection or medicines to reduce the amount of acid in your stomach.  Follow your health care provider's instructions about taking medicines, making changes to your diet, and knowing when to call for help. This information is not intended to replace advice given to you by your health care provider. Make sure you discuss any questions you have with your health care provider. Document Revised: 06/06/2017 Document Reviewed: 06/06/2017 Elsevier Patient Education  [employer].

## 2019-02-15 NOTE — Progress Notes (Signed)
Patient: Tammie Burke Female    DOB: 12/20/1990   29 y.o.   MRN: [id number] Visit Date: 02/15/2019  Today's Provider: Shirlee Latch, MD   No chief complaint on file.  Subjective:     HPI   Pt having issues with acid reflux, came back about 2 weeks ago. Tried to take tums for acid reflux but It did not help.  She has had episodes of this previously and tried PPI and H2 blocker.  She states that neither of these really did much to help.  She has had no changes in diet, weight, exercise, lifestyle within the last few months.  Her symptoms are epigastric pain, bloating, nausea.  She denies any vomiting or blood in her stool.  Her symptoms do not seem to fluctuate throughout the day or with eating.  She is not taking any NSAIDs.  She is about 4 months postpartum and breast-feeding currently.    No Known Allergies   Current Outpatient Medications:  .  Prenatal Vit-Fe Phos-FA-Omega (VITAFOL GUMMIES) 3.33-0.333-34.8 MG CHEW, Chew 3 each by mouth daily., Disp: 90 tablet, Rfl: 10  Review of Systems  Constitutional: Negative.   HENT: Negative.   Respiratory: Negative.   Cardiovascular: Negative.   Gastrointestinal: Positive for abdominal distention, abdominal pain and nausea. Negative for anal bleeding, blood in stool, constipation, diarrhea, rectal pain and vomiting.  Endocrine: Negative.   Genitourinary: Negative.   Skin: Negative.   Neurological: Negative.   Psychiatric/Behavioral: Negative.     Social History   Tobacco Use  . Smoking status: Never Smoker  . Smokeless tobacco: Never Used  Substance Use Topics  . Alcohol use: No      Objective:   There were no vitals taken for this visit. There were no vitals filed for this visit.There is no height or weight on file to calculate BMI.   Physical Exam Vitals reviewed.  Constitutional:      General: She is not in acute distress.    Appearance: Normal appearance. She is well-developed. She is not  diaphoretic.  HENT:     Head: Normocephalic and atraumatic.  Eyes:     General: No scleral icterus.    Conjunctiva/sclera: Conjunctivae normal.  Neck:     Thyroid: No thyromegaly.  Cardiovascular:     Rate and Rhythm: Normal rate and regular rhythm.     Pulses: Normal pulses.     Heart sounds: Normal heart sounds. No murmur.  Pulmonary:     Effort: Pulmonary effort is normal. No respiratory distress.     Breath sounds: Normal breath sounds. No wheezing, rhonchi or rales.  Abdominal:     General: Bowel sounds are normal. There is no distension.     Palpations: Abdomen is soft. There is no mass.     Tenderness: There is abdominal tenderness in the epigastric area. There is no guarding or rebound.  Musculoskeletal:     Cervical back: Neck supple.     Right lower leg: No edema.     Left lower leg: No edema.  Lymphadenopathy:     Cervical: No cervical adenopathy.  Skin:    General: Skin is warm and dry.     Capillary Refill: Capillary refill takes less than 2 seconds.     Findings: No rash.  Neurological:     Mental Status: She is alert and oriented to person, place, and time. Mental status is at baseline.  Psychiatric:        Mood  and Affect: Mood normal.        Behavior: Behavior normal.      No results found for any visits on 02/15/19.     Assessment & Plan    1. Gastroesophageal reflux disease, unspecified whether esophagitis present -Recurrent problem -Has previously not been helped by PPI or H2 blocker, but has self resolved on its own -She is originally from [place] and last traveled there in 2019 -Concern for possible H. pylori infection -H. pylori breath test to be performed today -Can start PPI while awaiting results -Discussed that if she is positive for H. pylori, then we will need to use antibiotic therapy to treat the H. pylori, which will take away these recurrent symptoms. -If unable to control this with PPI, may need to consider referral to GI in the  future - H. pylori breath test   Meds ordered this encounter  Medications  . omeprazole (PRILOSEC) 20 MG capsule    Sig: Take 1 capsule (20 mg total) by mouth daily.    Dispense:  30 capsule    Refill:  3     Return if symptoms worsen or fail to improve.   The entirety of the information documented in the History of Present Illness, Review of Systems and Physical Exam were personally obtained by me. Portions of this information were initially documented by Elonda Husky, CMA and reviewed by me for thoroughness and accuracy.    Ulonda Klosowski, Dionne Bucy, MD MPH [facility]

## 2019-02-20 ENCOUNTER — Telehealth: Payer: Self-pay

## 2019-02-20 LAB — H. PYLORI BREATH TEST: H pylori Breath Test: NEGATIVE

## 2019-02-20 NOTE — Telephone Encounter (Signed)
-----   Message from Erasmo Downer, MD sent at 02/20/2019 11:40 AM EST ----- H pylori negative.  Recommend resuming Omeprazole daily for at least 1 motnh.  Can use Tums as needed.  Decrease spicy, citrus, sour, tomato-based foods in her diet.  Do not eat less than 3 hours before laying down for bed at night.  Elevating the head of the bed can help as well

## 2019-02-20 NOTE — Telephone Encounter (Signed)
Comment seen by patient Tammie Burke on 02/20/2019 11:41 AM EST

## 2019-07-04 NOTE — Progress Notes (Signed)
See note from Daniel Le, medical student, attested by me from same date of service  

## 2019-07-05 ENCOUNTER — Encounter: Payer: Self-pay | Admitting: Family Medicine

## 2019-07-05 ENCOUNTER — Ambulatory Visit (INDEPENDENT_AMBULATORY_CARE_PROVIDER_SITE_OTHER): Payer: Managed Care, Other (non HMO) | Admitting: Family Medicine

## 2019-07-05 ENCOUNTER — Other Ambulatory Visit: Payer: Self-pay

## 2019-07-05 VITALS — BP 111/74 | HR 97 | Temp 96.6°F | Wt 146.6 lb

## 2019-07-05 DIAGNOSIS — K219 Gastro-esophageal reflux disease without esophagitis: Secondary | ICD-10-CM | POA: Diagnosis not present

## 2019-07-05 MED ORDER — ESOMEPRAZOLE MAGNESIUM 40 MG PO CPDR: 40.0000 mg | DELAYED_RELEASE_CAPSULE | Freq: Every day | ORAL | 3 refills | Status: DC

## 2019-07-05 NOTE — Patient Instructions (Signed)

## 2019-07-05 NOTE — Progress Notes (Signed)
Established patient visit   Patient: Tammie Burke   DOB: 10-06-1990   29 y.o. Female  MRN: [id number] Visit Date: 07/05/2019  Today's healthcare provider: Shirlee Latch, MD   Chief Complaint  Patient presents with   Gastroesophageal Reflux   Subjective    HPI  Follow up for GERD  The patient was last seen for this 5 months ago. Changes made at last visit include continuation on PPI. Results of H. Pylori breath test was negative.  She feels that condition is Worse. Pt states that she takes omeprezole 20mg  bid before meals Pt states PPI does not help Symptoms are more frequent and worse Symptoms are described as burning Pt states symptoms improve with drinking milk, water and lying down Pt states that symptom worsen with juice high in acid Pt states chest pain that radiates to the back between shoulder blades Pt with symptoms in the morning and night Pt states onset of pain is random Pt states that at the worst, pt feels tongue swelling   Denies changes to stool or urination, black tar stools, nausea vomiting, stomach pain, dysuria, vomiting, difficulty swallowing, difficulty chewing, headaches, dizziness, facial pain, palpitations, diaphoresis, no changes in appetite, changes to taste,   Pt is positive for night time cough that keeps her up at night   ------------------------------------------------------------------------------------     Social History   Tobacco Use   Smoking status: Never Smoker   Smokeless tobacco: Never Used  Substance Use Topics   Alcohol use: No   Drug use: No   Social History   Socioeconomic History   Marital status: Married    Spouse name:   Number of children: 1   Years of education: Not on file   Highest education level: Some college, no degree  Occupational History   Occupation: CNA  Tobacco Use   Smoking status: Never Smoker   Smokeless tobacco: Never Used  Substance and Sexual  Activity   Alcohol use: No   Drug use: No   Sexual activity: Not Currently    Birth control/protection: None  Other Topics Concern   Not on file  Social History Narrative   Not on file   Social Determinants of Health   Financial Resource Strain:    Difficulty of Paying Living Expenses:   Food Insecurity:    Worried About Solicitor in the Last Year:    Programme researcher, broadcasting/film/video in the Last Year:   Transportation Needs:    Barista (Medical):    Lack of Transportation (Non-Medical):   Physical Activity:    Days of Exercise per Week:    Minutes of Exercise per Session:   Stress:    Feeling of Stress :   Social Connections:    Frequency of Communication with Friends and Family:    Frequency of Social Gatherings with Friends and Family:    Attends Religious Services:    Active Member of Clubs or Organizations:    Attends Freight forwarder:    Marital Status:   Intimate Partner Violence:    Fear of Current or Ex-Partner:    Emotionally Abused:    Physically Abused:    Sexually Abused:        Medications: Outpatient Medications Prior to Visit  Medication Sig   Prenatal Vit-Fe Phos-FA-Omega (VITAFOL GUMMIES) 3.33-0.333-34.8 MG CHEW Chew 3 each by mouth daily.   [DISCONTINUED] omeprazole (PRILOSEC) 20 MG capsule Take 1 capsule (20 mg total) by  mouth daily.   No facility-administered medications prior to visit.    Review of Systems  Constitutional: Negative.   HENT: Negative.   Eyes: Negative.   Respiratory: Positive for cough.   Cardiovascular: Negative.   Gastrointestinal: Negative.   Endocrine: Negative.   Genitourinary: Negative.   Musculoskeletal: Negative.   Skin: Negative.   Allergic/Immunologic: Negative.   Neurological: Negative.   Hematological: Negative.   Psychiatric/Behavioral: Negative.     Last CBC Lab Results  Component Value Date   WBC 18.1 (H) 10/29/2018   HGB 9.8 (L) 10/29/2018   HCT  30.4 (L) 10/29/2018   MCV 80.9 10/29/2018   MCH 26.1 10/29/2018   RDW 15.2 10/29/2018   PLT 144 (L) 81/02/7508   Last metabolic panel Lab Results  Component Value Date   GLUCOSE 80 05/05/2017   NA 137 05/05/2017   K 3.5 05/05/2017   CL 102 05/05/2017   CO2 21 05/05/2017   BUN 13 05/05/2017   CREATININE 0.64 05/05/2017   GFRNONAA 122 05/05/2017   GFRAA 140 05/05/2017   CALCIUM 9.4 05/05/2017   PROT 7.4 05/05/2017   ALBUMIN 4.3 05/05/2017   LABGLOB 3.1 05/05/2017   AGRATIO 1.4 05/05/2017   BILITOT 0.3 05/05/2017   ALKPHOS 72 05/05/2017   AST 13 05/05/2017   ALT 9 05/05/2017   Last lipids No results found for: CHOL, HDL, LDLCALC, LDLDIRECT, TRIG, CHOLHDL    Objective    BP 111/74 (BP Location: Left Arm, Patient Position: Sitting, Cuff Size: Normal)    Pulse 97    Temp (!) 96.6 F (35.9 C) (Temporal)    Wt 146 lb 9.6 oz (66.5 kg)    BMI 22.96 kg/m  BP Readings from Last 3 Encounters:  07/05/19 111/74  02/15/19 105/72  12/21/18 106/67   Wt Readings from Last 3 Encounters:  07/05/19 146 lb 9.6 oz (66.5 kg)  02/15/19 139 lb 3.2 oz (63.1 kg)  12/21/18 137 lb 4.8 oz (62.3 kg)      Physical Exam Constitutional:      Appearance: Normal appearance. She is normal weight.  HENT:     Head: Normocephalic and atraumatic.     Nose: Nose normal.     Mouth/Throat:     Mouth: Mucous membranes are moist.     Pharynx: Oropharynx is clear.  Eyes:     Extraocular Movements: Extraocular movements intact.     Conjunctiva/sclera: Conjunctivae normal.     Pupils: Pupils are equal, round, and reactive to light.  Cardiovascular:     Rate and Rhythm: Normal rate and regular rhythm.     Pulses: Normal pulses.     Heart sounds: Normal heart sounds.  Pulmonary:     Effort: Pulmonary effort is normal.     Breath sounds: Normal breath sounds.  Abdominal:     General: Bowel sounds are normal. There is no distension or abdominal bruit. There are no signs of injury.     Palpations:  Abdomen is soft. There is no shifting dullness, fluid wave, hepatomegaly, splenomegaly, mass or pulsatile mass.     Tenderness: There is abdominal tenderness (tenderness to deep palpation) in the epigastric area and left upper quadrant. There is no guarding or rebound.  Musculoskeletal:     Cervical back: Normal range of motion and neck supple.     Right lower leg: No edema.     Left lower leg: No edema.  Skin:    General: Skin is warm and dry.     Capillary  Refill: Capillary refill takes less than 2 seconds.  Neurological:     General: No focal deficit present.     Mental Status: She is alert and oriented to person, place, and time.  Psychiatric:        Mood and Affect: Mood normal.        Behavior: Behavior normal.     No results found for any visits on 07/05/19.  Assessment & Plan     Problem List Items Addressed This Visit      Digestive   GERD (gastroesophageal reflux disease) - Primary    Pt with history of GERD with no improvement on PPIs Pt is currently taking omeprazole 20mg  bid with no relief Symptoms and PE most consistent with GERD  Considering gastric ulcer given location of pain, however less likely given pt denies black tar stools Considering esophageal abnormality given duration of symptoms and symptoms refractory to initial treatment  Plan: Will change medication to nexium 40mg  Will refer to GI for evaluation Discussed with pt the importance of continuing diet changes to avoid exacerbation of symptoms      Relevant Medications   esomeprazole (NEXIUM) 40 MG capsule   Other Relevant Orders   Ambulatory referral to Gastroenterology       Return in about 2 months (around 09/04/2019) for Annual Physical.      , Medical Student [facility]  [facility] [phone] (phone) [phone] (fax)  [facility]

## 2019-07-05 NOTE — Assessment & Plan Note (Signed)
Pt with history of GERD with no improvement on PPIs Pt is currently taking omeprazole 20mg  bid with no relief Symptoms and PE most consistent with GERD  Considering gastric ulcer given location of pain, however less likely given pt denies black tar stools Considering esophageal abnormality given duration of symptoms and symptoms refractory to initial treatment  Plan: Will change medication to nexium 40mg  Will refer to GI for evaluation Discussed with pt the importance of continuing diet changes to avoid exacerbation of symptoms

## 2019-07-16 ENCOUNTER — Encounter: Payer: Self-pay | Admitting: *Deleted

## 2019-09-06 ENCOUNTER — Encounter: Payer: Managed Care, Other (non HMO) | Admitting: Family Medicine

## 2019-09-13 ENCOUNTER — Encounter: Payer: Managed Care, Other (non HMO) | Admitting: Certified Nurse Midwife

## 2019-11-21 ENCOUNTER — Encounter: Payer: Managed Care, Other (non HMO) | Admitting: Certified Nurse Midwife

## 2019-11-22 ENCOUNTER — Other Ambulatory Visit: Payer: Self-pay

## 2019-11-22 ENCOUNTER — Ambulatory Visit (INDEPENDENT_AMBULATORY_CARE_PROVIDER_SITE_OTHER): Payer: Managed Care, Other (non HMO) | Admitting: Certified Nurse Midwife

## 2019-11-22 ENCOUNTER — Encounter: Payer: Self-pay | Admitting: Certified Nurse Midwife

## 2019-11-22 ENCOUNTER — Other Ambulatory Visit (HOSPITAL_COMMUNITY)
Admission: RE | Admit: 2019-11-22 | Discharge: 2019-11-22 | Disposition: A | Discharge status: Home / Self Care | Payer: Managed Care, Other (non HMO) | Source: Ambulatory Visit | Attending: Certified Nurse Midwife | Admitting: Certified Nurse Midwife

## 2019-11-22 VITALS — BP 120/80 | Ht 67.0 in | Wt 138.2 lb

## 2019-11-22 DIAGNOSIS — R87610 Atypical squamous cells of undetermined significance on cytologic smear of cervix (ASC-US): Secondary | ICD-10-CM | POA: Diagnosis not present

## 2019-11-22 DIAGNOSIS — Z01419 Encounter for gynecological examination (general) (routine) without abnormal findings: Secondary | ICD-10-CM | POA: Insufficient documentation

## 2019-11-22 DIAGNOSIS — N921 Excessive and frequent menstruation with irregular cycle: Secondary | ICD-10-CM

## 2019-11-22 DIAGNOSIS — Z124 Encounter for screening for malignant neoplasm of cervix: Secondary | ICD-10-CM

## 2019-11-22 DIAGNOSIS — Z975 Presence of (intrauterine) contraceptive device: Secondary | ICD-10-CM

## 2019-11-22 NOTE — Progress Notes (Signed)
ANNUAL PREVENTATIVE CARE GYN  ENCOUNTER NOTE  Subjective:       Tammie Burke is a 29 y.o. G37P1102 female here for a routine annual gynecologic exam.  Current complaints: 1. Breakthrough bleeding with Nexplanon-bleeding weekly  Denies difficulty breathing or respiratory distress, chest pain, abdominal pain, excessive vaginal bleeding, dysuria, and leg pain or swelling.    Gynecologic History  Patient's last menstrual period was 11/21/2019. Period Pattern: (!) Irregular Menstrual Flow: Light Menstrual Control: Maxi pad Menstrual Control Change Freq (Hours): 5 Dysmenorrhea: None  Contraception: Nexplanon  Last Pap: 08/2016. Results were: abnormal, ASC-US/HPV negative  Obstetric History  OB History  Gravida Para Term Preterm AB Living  2 2 1 1   2   SAB TAB Ectopic Multiple Live Births        0 2    # Outcome Date GA Lbr Len/2nd Weight Sex Delivery Anes PTL Lv  2 Term 10/28/18 [redacted]w[redacted]d 01:45 / 00:41 5 lb 11.7 oz (2.6 kg) M VBAC Local  LIV  1 Preterm 02/19/16 [redacted]w[redacted]d  5 lb 1 oz (2.296 kg) F CS-LTranv EPI Y LIV     Complications: Failure to Progress in Second Stage    Past Medical History:  Diagnosis Date  . Anemia   . 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 CESAREAN SECTION N/A 02/19/2016   Procedure: CESAREAN SECTION;  Surgeon: 02/21/2016, MD;  Location: [facility] ORS;  Service: Obstetrics;  Laterality: N/A;    Current Outpatient Medications on File Prior to Visit  Medication Sig Dispense Refill  . esomeprazole (NEXIUM) 40 MG capsule Take 1 capsule (40 mg total) by mouth daily. 30 capsule 3  . Prenatal Vit-Fe Phos-FA-Omega (VITAFOL GUMMIES) 3.33-0.333-34.8 MG CHEW Chew 3 each by mouth daily. 90 tablet 10   No current facility-administered medications on file prior to visit.    No Known Allergies  Social History   Socioeconomic History  . Marital status: Married    Spouse name: [id number]  . Number of children: 1   . Years of education: Not on file  . Highest education level: Some college, no degree  Occupational History  . Occupation: CNA  Tobacco Use  . Smoking status: Never Smoker  . Smokeless tobacco: Never Used  Vaping Use  . Vaping Use: Never used  Substance and Sexual Activity  . Alcohol use: No  . Drug use: No  . Sexual activity: Yes    Birth control/protection: Inserts    Comment: Nexplanon  Other Topics Concern  . Not on file  Social History Narrative  . Not on file   Social Determinants of Health   Financial Resource Strain:   . Difficulty of Paying Living Expenses: Not on file  Food Insecurity:   . Worried About Solicitor in the Last Year: Not on file  . Ran Out of Food in the Last Year: Not on file  Transportation Needs:   . Lack of Transportation (Medical): Not on file  . Lack of Transportation (Non-Medical): Not on file  Physical Activity:   . Days of Exercise per Week: Not on file  . Minutes of Exercise per Session: Not on file  Stress:   . Feeling of Stress : Not on file  Social Connections:   . Frequency of Communication with Friends and Family: Not on file  . Frequency of Social Gatherings with Friends and Family: Not on file  . Attends Religious Services: Not on file  .  Active Member of Clubs or Organizations: Not on file  . Attends Banker Meetings: Not on file  . Marital Status: Not on file  Intimate Partner Violence:   . Fear of Current or Ex-Partner: Not on file  . Emotionally Abused: Not on file  . Physically Abused: Not on file  . Sexually Abused: Not on file    Family History  Problem Relation Age of Onset  . Healthy Mother   . Healthy Sister   . Healthy Brother   . Cancer Neg Hx   . Diabetes Neg Hx   . Hypertension Neg Hx   . Thyroid disease Neg Hx     The following portions of the patient's history were reviewed and updated as appropriate: allergies, current medications, past family history, past medical history,  past social history, past surgical history and problem list.  Review of Systems  ROS negative except as noted above. Information obtained from patient.    Objective:   BP 120/80   Ht 5\' 7"  (1.702 m)   Wt 138 lb 3.2 oz (62.7 kg)   LMP 11/21/2019   Breastfeeding Yes   BMI 21.65 kg/m    CONSTITUTIONAL: Well-developed, well-nourished female in no acute distress.   PSYCHIATRIC: Normal mood and affect. Normal behavior. Normal judgment and thought content.  NEUROLGIC: Alert and oriented to person, place, and time. Normal muscle tone coordination. No cranial nerve deficit noted.  HENT:  Normocephalic, atraumatic, External right and left ear normal.   EYES: Conjunctivae and EOM are normal. Pupils are equal and round.   NECK: Normal range of motion, supple, no masses.  Normal thyroid.   SKIN: Skin is warm and dry. No rash noted. Not diaphoretic. No erythema. No pallor. Nexplanon in left arm.   CARDIOVASCULAR: Normal heart rate noted, regular rhythm, no murmur.  RESPIRATORY: Clear to auscultation bilaterally. Effort and breath sounds normal, no problems with respiration noted.  BREASTS: Symmetric in size. No masses, skin changes, nipple drainage, or lymphadenopathy. Lactating.   ABDOMEN: Soft, normal bowel sounds, no distention noted.  No tenderness, rebound or guarding.   PELVIC:  External Genitalia: Normal  Vagina: Normal  Cervix: Normal, Pap collected  Uterus: Normal  Adnexa: Normal  MUSCULOSKELETAL: Normal range of motion. No tenderness.  No cyanosis, clubbing, or edema.  2+ distal pulses.  LYMPHATIC: No Axillary, Supraclavicular, or Inguinal Adenopathy.  Assessment:   Annual gynecologic examination 29 y.o.   Contraception: Nexplanon   Normal BMI   Problem List Items Addressed This Visit      Other   Nexplanon in place    Other Visit Diagnoses    Well woman exam    -  Primary   Relevant Orders   Cytology - PAP   Breakthrough bleeding on Nexplanon        Screening for cervical cancer       Relevant Orders   Cytology - PAP   ASCUS of cervix with negative high risk HPV       Relevant Orders   Cytology - PAP      Plan:   Pap: Pap Co Test  Labs: Declined   Routine preventative health maintenance measures emphasized: Exercise/Diet/Weight control, Tobacco Warnings, Alcohol/Substance use risks and Stress Management; see AVS  Samples of Lo Loestrin given to help with breakthrough bleeding  Reviewed red flag symptoms and when to call  Return to Clinic - 1 Year for 38 or sooner if needed   [employer], CNM  [facility],  [facility] 11/22/19 10:35 AM

## 2019-11-22 NOTE — Patient Instructions (Addendum)
Etonogestrel implant What is this medicine? ETONOGESTREL (et oh noe JES trel) is a contraceptive (birth control) device. It is used to prevent pregnancy. It can be used for up to 3 years. This medicine may be used for other purposes; ask your health care provider or pharmacist if you have questions. COMMON BRAND NAME(S): Implanon, Nexplanon What should I tell my health care provider before I take this medicine? They need to know if you have any of these conditions:  abnormal vaginal bleeding  blood vessel disease or blood clots  breast, cervical, endometrial, ovarian, liver, or uterine cancer  diabetes  gallbladder disease  heart disease or recent heart attack  high blood pressure  high cholesterol or triglycerides  kidney disease  liver disease  migraine headaches  seizures  stroke  tobacco smoker  an unusual or allergic reaction to etonogestrel, anesthetics or antiseptics, other medicines, foods, dyes, or preservatives  pregnant or trying to get pregnant  breast-feeding How should I use this medicine? This device is inserted just under the skin on the inner side of your upper arm by a health care professional. Talk to your pediatrician regarding the use of this medicine in children. Special care may be needed. Overdosage: If you think you have taken too much of this medicine contact a poison control center or emergency room at once. NOTE: This medicine is only for you. Do not share this medicine with others. What if I miss a dose? This does not apply. What may interact with this medicine? Do not take this medicine with any of the following medications:  amprenavir  fosamprenavir This medicine may also interact with the following medications:  acitretin  aprepitant  armodafinil  bexarotene  bosentan  carbamazepine  certain medicines for fungal infections like fluconazole, ketoconazole, itraconazole and voriconazole  certain medicines to treat  hepatitis, HIV or AIDS  cyclosporine  felbamate  griseofulvin  lamotrigine  modafinil  oxcarbazepine  phenobarbital  phenytoin  primidone  rifabutin  rifampin  rifapentine  St. John's wort  topiramate This list may not describe all possible interactions. Give your health care provider a list of all the medicines, herbs, non-prescription drugs, or dietary supplements you use. Also tell them if you smoke, drink alcohol, or use illegal drugs. Some items may interact with your medicine. What should I watch for while using this medicine? This product does not protect you against HIV infection (AIDS) or other sexually transmitted diseases. You should be able to feel the implant by pressing your fingertips over the skin where it was inserted. Contact your doctor if you cannot feel the implant, and use a non-hormonal birth control method (such as condoms) until your doctor confirms that the implant is in place. Contact your doctor if you think that the implant may have broken or become bent while in your arm. You will receive a user card from your health care provider after the implant is inserted. The card is a record of the location of the implant in your upper arm and when it should be removed. Keep this card with your health records. What side effects may I notice from receiving this medicine? Side effects that you should report to your doctor or health care professional as soon as possible:  allergic reactions like skin rash, itching or hives, swelling of the face, lips, or tongue  breast lumps, breast tissue changes, or discharge  breathing problems  changes in emotions or moods  coughing up blood  if you feel that the implant  may have broken or bent while in your arm  high blood pressure  pain, irritation, swelling, or bruising at the insertion site  scar at site of insertion  signs of infection at the insertion site such as fever, and skin redness, pain or  discharge  signs and symptoms of a blood clot such as breathing problems; changes in vision; chest pain; severe, sudden headache; pain, swelling, warmth in the leg; trouble speaking; sudden numbness or weakness of the face, arm or leg  signs and symptoms of liver injury like dark yellow or brown urine; general ill feeling or flu-like symptoms; light-colored stools; loss of appetite; nausea; right upper belly pain; unusually weak or tired; yellowing of the eyes or skin  unusual vaginal bleeding, discharge Side effects that usually do not require medical attention (report to your doctor or health care professional if they continue or are bothersome):  acne  breast pain or tenderness  headache  irregular menstrual bleeding  nausea This list may not describe all possible side effects. Call your doctor for medical advice about side effects. You may report side effects to FDA at 1-800-FDA-1088. Where should I keep my medicine? This drug is given in a hospital or clinic and will not be stored at home. NOTE: This sheet is a summary. It may not cover all possible information. If you have questions about this medicine, talk to your doctor, pharmacist, or health care provider.  2020 Elsevier/Gold Standard (2018-10-30 11:33:04)   Pap Test Why am I having this test? A Pap test, also called a Pap smear, is a screening test to check for signs of:  Cancer of the vagina, cervix, and uterus. The cervix is the lower part of the uterus that opens into the vagina.  Infection.  Changes that may be a sign that cancer is developing (precancerous changes). Women need this test on a regular basis. In general, you should have a Pap test every 3 years until you reach menopause or age 24. Women aged 30-60 may choose to have their Pap test done at the same time as an HPV (human papillomavirus) test every 5 years (instead of every 3 years). Your health care provider may recommend having Pap tests more or less  often depending on your medical conditions and past Pap test results. What kind of sample is taken?  Your health care provider will collect a sample of cells from the surface of your cervix. This will be done using a small cotton swab, plastic spatula, or brush. This sample is often collected during a pelvic exam, when you are lying on your back on an exam table with feet in footrests (stirrups). In some cases, fluids (secretions) from the cervix or vagina may also be collected. How do I prepare for this test?  Be aware of where you are in your menstrual cycle. If you are menstruating on the day of the test, you may be asked to reschedule.  You may need to reschedule if you have a known vaginal infection on the day of the test.  Follow instructions from your health care provider about: ? Changing or stopping your regular medicines. Some medicines can cause abnormal test results, such as digitalis and tetracycline. ? Avoiding douching or taking a bath the day before or the day of the test. Tell a health care provider about:  Any allergies you have.  All medicines you are taking, including vitamins, herbs, eye drops, creams, and over-the-counter medicines.  Any blood disorders you have.  Any  surgeries you have had.  Any medical conditions you have.  Whether you are pregnant or may be pregnant. How are the results reported? Your test results will be reported as either abnormal or normal. A false-positive result can occur. A false positive is incorrect because it means that a condition is present when it is not. A false-negative result can occur. A false negative is incorrect because it means that a condition is not present when it is. What do the results mean? A normal test result means that you do not have signs of cancer of the vagina, cervix, or uterus. An abnormal result may mean that you have:  Cancer. A Pap test by itself is not enough to diagnose cancer. You will have more  tests done in this case.  Precancerous changes in your vagina, cervix, or uterus.  Inflammation of the cervix.  An STD (sexually transmitted disease).  A fungal infection.  A parasite infection. Talk with your health care provider about what your results mean. Questions to ask your health care provider Ask your health care provider, or the department that is doing the test:  When will my results be ready?  How will I get my results?  What are my treatment options?  What other tests do I need?  What are my next steps? Summary  In general, women should have a Pap test every 3 years until they reach menopause or age 65.  Your health care provider will collect a sample of cells from the surface of your cervix. This will be done using a small cotton swab, plastic spatula, or brush.  In some cases, fluids (secretions) from the cervix or vagina may also be collected. This information is not intended to replace advice given to you by your health care provider. Make sure you discuss any questions you have with your health care provider. Document Revised: 09/26/2016 Document Reviewed: 09/26/2016 Elsevier Patient Education  2020 Elsevier Inc.    Preventive Care 21-39 Years Old, Female Preventive care refers to visits with your health care provider and lifestyle choices that can promote health and wellness. This includes:  A yearly physical exam. This may also be called an annual well check.  Regular dental visits and eye exams.  Immunizations.  Screening for certain conditions.  Healthy lifestyle choices, such as eating a healthy diet, getting regular exercise, not using drugs or products that contain nicotine and tobacco, and limiting alcohol use. What can I expect for my preventive care visit? Physical exam Your health care provider will check your:  Height and weight. This may be used to calculate body mass index (BMI), which tells if you are at a healthy  weight.  Heart rate and blood pressure.  Skin for abnormal spots. Counseling Your health care provider may ask you questions about your:  Alcohol, tobacco, and drug use.  Emotional well-being.  Home and relationship well-being.  Sexual activity.  Eating habits.  Work and work environment.  Method of birth control.  Menstrual cycle.  Pregnancy history. What immunizations do I need?  Influenza (flu) vaccine  This is recommended every year. Tetanus, diphtheria, and pertussis (Tdap) vaccine  You may need a Td booster every 10 years. Varicella (chickenpox) vaccine  You may need this if you have not been vaccinated. Human papillomavirus (HPV) vaccine  If recommended by your health care provider, you may need three doses over 6 months. Measles, mumps, and rubella (MMR) vaccine  You may need at least one dose of MMR. You   may also need a second dose. Meningococcal conjugate (MenACWY) vaccine  One dose is recommended if you are age 19-21 years and a first-year college student living in a residence hall, or if you have one of several medical conditions. You may also need additional booster doses. Pneumococcal conjugate (PCV13) vaccine  You may need this if you have certain conditions and were not previously vaccinated. Pneumococcal polysaccharide (PPSV23) vaccine  You may need one or two doses if you smoke cigarettes or if you have certain conditions. Hepatitis A vaccine  You may need this if you have certain conditions or if you travel or work in places where you may be exposed to hepatitis A. Hepatitis B vaccine  You may need this if you have certain conditions or if you travel or work in places where you may be exposed to hepatitis B. Haemophilus influenzae type b (Hib) vaccine  You may need this if you have certain conditions. You may receive vaccines as individual doses or as more than one vaccine together in one shot (combination vaccines). Talk with your health  care provider about the risks and benefits of combination vaccines. What tests do I need?  Blood tests  Lipid and cholesterol levels. These may be checked every 5 years starting at age 20.  Hepatitis C test.  Hepatitis B test. Screening  Diabetes screening. This is done by checking your blood sugar (glucose) after you have not eaten for a while (fasting).  Sexually transmitted disease (STD) testing.  BRCA-related cancer screening. This may be done if you have a family history of breast, ovarian, tubal, or peritoneal cancers.  Pelvic exam and Pap test. This may be done every 3 years starting at age 21. Starting at age 30, this may be done every 5 years if you have a Pap test in combination with an HPV test. Talk with your health care provider about your test results, treatment options, and if necessary, the need for more tests. Follow these instructions at home: Eating and drinking   Eat a diet that includes fresh fruits and vegetables, whole grains, lean protein, and low-fat dairy.  Take vitamin and mineral supplements as recommended by your health care provider.  Do not drink alcohol if: ? Your health care provider tells you not to drink. ? You are pregnant, may be pregnant, or are planning to become pregnant.  If you drink alcohol: ? Limit how much you have to 0-1 drink a day. ? Be aware of how much alcohol is in your drink. In the [place] one drink equals one 12 oz bottle of beer (355 mL), one 5 oz glass of wine (148 mL), or one 1 oz glass of hard liquor (44 mL). Lifestyle  Take daily care of your teeth and gums.  Stay active. Exercise for at least 30 minutes on 5 or more days each week.  Do not use any products that contain nicotine or tobacco, such as cigarettes, e-cigarettes, and chewing tobacco. If you need help quitting, ask your health care provider.  If you are sexually active, practice safe sex. Use a condom or other form of birth control (contraception) in order  to prevent pregnancy and STIs (sexually transmitted infections). If you plan to become pregnant, see your health care provider for a preconception visit. What's next?  Visit your health care provider once a year for a well check visit.  Ask your health care provider how often you should have your eyes and teeth checked.  Stay up to date   on all vaccines. This information is not intended to replace advice given to you by your health care provider. Make sure you discuss any questions you have with your health care provider. Document Revised: 09/28/2017 Document Reviewed: 09/28/2017 Elsevier Patient Education  2020 Elsevier Inc.  

## 2019-11-25 LAB — CYTOLOGY - PAP
Comment: NEGATIVE
Diagnosis: NEGATIVE
High risk HPV: NEGATIVE

## 2020-01-07 ENCOUNTER — Ambulatory Visit: Payer: Managed Care, Other (non HMO) | Admitting: Family Medicine

## 2020-01-10 ENCOUNTER — Encounter: Payer: Managed Care, Other (non HMO) | Admitting: Family Medicine

## 2020-02-21 ENCOUNTER — Encounter: Payer: Managed Care, Other (non HMO) | Admitting: Family Medicine

## 2020-04-13 ENCOUNTER — Telehealth: Payer: Self-pay

## 2020-04-13 NOTE — Telephone Encounter (Signed)
Called patient to reschedule appointment from 03/28 per patient this is the third time that she is being rescheduled for a CPE. Rescheduled from 01-10-20 & 02-21-20. Asked the patient if she didn't mind seeing another provider. She requested to be schedule with Dr. Leonard Schwartz on Monday or Friday went all the way to June and no appointment available on this days. Per patient can Dr. Leonard Schwartz see her on a Monday or Friday before June please. Please advise.

## 2020-04-14 NOTE — Telephone Encounter (Signed)
My schedule for next 2 weeks is labeled with what 40 min appts are ok to change to 20. Then you can use any of those slots to get her CPE scheduled. Only need 20 min for her CPE. Thanks!

## 2020-04-15 NOTE — Telephone Encounter (Signed)
Left message for patient to come on 04/24/20 at 9;20 a.m.  If this is not convenient, she can call back to resch.

## 2020-04-24 ENCOUNTER — Other Ambulatory Visit: Payer: Self-pay

## 2020-04-24 ENCOUNTER — Ambulatory Visit (INDEPENDENT_AMBULATORY_CARE_PROVIDER_SITE_OTHER): Payer: Managed Care, Other (non HMO) | Admitting: Family Medicine

## 2020-04-24 ENCOUNTER — Encounter: Payer: Self-pay | Admitting: Family Medicine

## 2020-04-24 VITALS — BP 106/71 | HR 76 | Temp 98.4°F | Resp 16 | Ht 67.0 in | Wt 138.3 lb

## 2020-04-24 DIAGNOSIS — Z1159 Encounter for screening for other viral diseases: Secondary | ICD-10-CM | POA: Diagnosis not present

## 2020-04-24 DIAGNOSIS — Z0000 Encounter for general adult medical examination without abnormal findings: Secondary | ICD-10-CM | POA: Diagnosis not present

## 2020-04-24 DIAGNOSIS — D649 Anemia, unspecified: Secondary | ICD-10-CM

## 2020-04-24 NOTE — Progress Notes (Signed)
Complete physical exam   Patient: Tammie Burke   DOB: 03/11/1990   29 y.o. Female  MRN: [id number] Visit Date: 04/24/2020  Today's healthcare provider: Lavon Paganini, MD   Chief Complaint  Patient presents with   Annual Exam   Subjective    Tammie Burke is a 30 y.o. female who presents today for a complete physical exam.  She reports consuming a general diet. Gym/ health club routine includes cardio. She generally feels well. She reports sleeping well. She does not have additional problems to discuss today.  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   Past Surgical History:  Procedure Laterality Date   CESAREAN SECTION N/A 02/19/2016   Procedure: CESAREAN SECTION;  Surgeon: Will Bonnet, MD;  Location: [facility] ORS;  Service: Obstetrics;  Laterality: N/A;   NO PAST SURGERIES     Social History   Socioeconomic History   Marital status: Married    Spouse name: Catering manager   Number of children: 2   Years of education: Not on file   Highest education level: Some college, no degree  Occupational History   Occupation: CNA  Tobacco Use   Smoking status: Never Smoker   Smokeless tobacco: Never Used  Scientific laboratory technician Use: Never used  Substance and Sexual Activity   Alcohol use: No   Drug use: No   Sexual activity: Yes    Birth control/protection: Inserts    Comment: Nexplanon  Other Topics Concern   Not on file  Social History Narrative   Not on file   Social Determinants of Health   Financial Resource Strain: Not on file  Food Insecurity: Not on file  Transportation Needs: Not on file  Physical Activity:  Not on file  Stress: Not on file  Social Connections: Not on file  Intimate Partner Violence: Not on file   Family Status  Relation Name Status   Mother  Alive   Father  Deceased       accidental death   Sister x 67 Alive   Brother x 3 Alive   Daughter  Alive   Son  Alive   Neg Hx  (Not Specified)   Family History  Problem Relation Age of Onset   Healthy Mother    Healthy Sister    Healthy Brother    Cancer Neg Hx    Diabetes Neg Hx    Hypertension Neg Hx    Thyroid disease Neg Hx    No Known Allergies  Patient Care Team: Virginia Crews, MD as PCP - General (Family Medicine)   Medications: Outpatient Medications Prior to Visit  Medication Sig   [DISCONTINUED] esomeprazole (NEXIUM) 40 MG capsule Take 1 capsule (40 mg total) by mouth daily. (Patient not taking: Reported on 04/24/2020)   [DISCONTINUED] Prenatal Vit-Fe Phos-FA-Omega (VITAFOL GUMMIES) 3.33-0.333-34.8 MG CHEW Chew 3 each by mouth daily. (Patient not taking: Reported on 04/24/2020)   No facility-administered medications prior to visit.    Review of Systems  All other systems reviewed and are negative.   Last CBC Lab Results  Component Value Date   WBC 18.1 (H) 10/29/2018  HGB 9.8 (L) 10/29/2018   HCT 30.4 (L) 10/29/2018   MCV 80.9 10/29/2018   MCH 26.1 10/29/2018   RDW 15.2 10/29/2018   PLT 144 (L) 29/93/7169   Last metabolic panel Lab Results  Component Value Date   GLUCOSE 80 05/05/2017   NA 137 05/05/2017   K 3.5 05/05/2017   CL 102 05/05/2017   CO2 21 05/05/2017   BUN 13 05/05/2017   CREATININE 0.64 05/05/2017   GFRNONAA 122 05/05/2017   GFRAA 140 05/05/2017   CALCIUM 9.4 05/05/2017   PROT 7.4 05/05/2017   ALBUMIN 4.3 05/05/2017   LABGLOB 3.1 05/05/2017   AGRATIO 1.4 05/05/2017   BILITOT 0.3 05/05/2017   ALKPHOS 72 05/05/2017   AST 13 05/05/2017   ALT 9 05/05/2017   Last lipids No results found for: CHOL, HDL, LDLCALC, LDLDIRECT, TRIG, CHOLHDL Last  hemoglobin A1c No results found for: HGBA1C Last thyroid functions No results found for: TSH, T3TOTAL, T4TOTAL, THYROIDAB Last vitamin D No results found for: 25OHVITD2, 25OHVITD3, VD25OH Last vitamin B12 and Folate No results found for: VITAMINB12, FOLATE    Objective    BP 106/71 (BP Location: Left Arm, Patient Position: Sitting, Cuff Size: Normal)    Pulse 76    Temp 98.4 F (36.9 C) (Oral)    Resp 16    Ht 5' 7"  (1.702 m)    Wt 138 lb 4.8 oz (62.7 kg)    SpO2 100%    Breastfeeding No    BMI 21.66 kg/m  BP Readings from Last 3 Encounters:  04/24/20 106/71  11/22/19 120/80  07/05/19 111/74   Wt Readings from Last 3 Encounters:  04/24/20 138 lb 4.8 oz (62.7 kg)  11/22/19 138 lb 3.2 oz (62.7 kg)  07/05/19 146 lb 9.6 oz (66.5 kg)      Physical Exam Vitals reviewed.  Constitutional:      General: She is not in acute distress.    Appearance: Normal appearance. She is well-developed. She is not diaphoretic.  HENT:     Head: Normocephalic and atraumatic.     Right Ear: Tympanic membrane, ear canal and external ear normal.     Left Ear: Tympanic membrane, ear canal and external ear normal.     Nose: Nose normal.     Mouth/Throat:     Mouth: Mucous membranes are moist.     Pharynx: Oropharynx is clear. No oropharyngeal exudate.  Eyes:     General: No scleral icterus.    Conjunctiva/sclera: Conjunctivae normal.     Pupils: Pupils are equal, round, and reactive to light.  Neck:     Thyroid: No thyromegaly.  Cardiovascular:     Rate and Rhythm: Normal rate and regular rhythm.     Pulses: Normal pulses.     Heart sounds: Normal heart sounds. No murmur heard.   Pulmonary:     Effort: Pulmonary effort is normal. No respiratory distress.     Breath sounds: Normal breath sounds. No wheezing or rales.  Chest:     Comments: Breasts: breasts appear normal, no suspicious masses, no skin or nipple changes or axillary nodes.  Abdominal:     General: There is no distension.      Palpations: Abdomen is soft.     Tenderness: There is no abdominal tenderness.  Musculoskeletal:        General: No deformity.     Cervical back: Neck supple.     Right lower leg: No edema.     Left lower leg:  No edema.  Lymphadenopathy:     Cervical: No cervical adenopathy.  Skin:    General: Skin is warm and dry.     Findings: No rash.  Neurological:     Mental Status: She is alert and oriented to person, place, and time. Mental status is at baseline.     Sensory: No sensory deficit.     Motor: No weakness.     Gait: Gait normal.  Psychiatric:        Mood and Affect: Mood normal.        Behavior: Behavior normal.        Thought Content: Thought content normal.       Last depression screening scores PHQ 2/9 Scores 04/24/2020 12/06/2018  PHQ - 2 Score 0 0  PHQ- 9 Score 0 0   Last fall risk screening Fall Risk  04/24/2020  Falls in the past year? 0  Number falls in past yr: 0  Injury with Fall? 0  Risk for fall due to : No Fall Risks  Follow up Falls evaluation completed   Last Audit-C alcohol use screening Alcohol Use Disorder Test (AUDIT) 04/24/2020  1. How often do you have a drink containing alcohol? 0  2. How many drinks containing alcohol do you have on a typical day when you are drinking? 0  3. How often do you have six or more drinks on one occasion? 0  AUDIT-C Score 0  Alcohol Brief Interventions/Follow-up AUDIT Score <7 follow-up not indicated   A score of 3 or more in women, and 4 or more in men indicates increased risk for alcohol abuse, EXCEPT if all of the points are from question 1   No results found for any visits on 04/24/20.  Assessment & Plan    Routine Health Maintenance and Physical Exam  Exercise Activities and Dietary recommendations Goals   None     Immunization History  Administered Date(s) Administered   Influenza Split 02/13/2017   Influenza,inj,Quad PF,6+ Mos 10/29/2018   Influenza-Unspecified 11/21/2019   MMR 12/04/2014    Moderna Sars-Covid-2 Vaccination 12/11/2019   Td 12/04/2014   Tdap 08/28/2018    Health Maintenance  Topic Date Due   Hepatitis C Screening  Never done   COVID-19 Vaccine (2 - Moderna 3-dose series) 10/21/2020 (Originally 01/08/2020)   PAP SMEAR-Modifier  11/21/2024   TETANUS/TDAP  08/27/2028   INFLUENZA VACCINE  Completed   HIV Screening  Completed   HPV VACCINES  Aged Out    Discussed health benefits of physical activity, and encouraged her to engage in regular exercise appropriate for her age and condition.  Problem List Items Addressed This Visit      Other   Anemia   Relevant Orders   CBC w/Diff/Platelet    Other Visit Diagnoses    Encounter for annual physical exam    -  Primary   Relevant Orders   Hepatitis C Antibody   CBC w/Diff/Platelet   Comprehensive metabolic panel   Lipid panel   TSH   Need for hepatitis C screening test       Relevant Orders   Hepatitis C Antibody       Return in about 1 year (around 04/24/2021) for CPE.     I, Lavon Paganini, MD, have reviewed all documentation for this visit. The documentation on 04/24/20 for the exam, diagnosis, procedures, and orders are all accurate and complete.   Kalena Mander, Dionne Bucy, MD, MPH [facility]

## 2020-04-24 NOTE — Patient Instructions (Signed)
Congratulations on your Covid-19 vaccination!  Please bring in your Covid-19 vaccination card to your next appointment or upload a picture to your MyChart for [place] to keep track of your vaccinations.  Thank you!     Preventive Care 34-32 Years Old, Female Preventive care refers to lifestyle choices and visits with your health care provider that can promote health and wellness. This includes:  A yearly physical exam. This is also called an annual wellness visit.  Regular dental and eye exams.  Immunizations.  Screening for certain conditions.  Healthy lifestyle choices, such as: ? Eating a healthy diet. ? Getting regular exercise. ? Not using drugs or products that contain nicotine and tobacco. ? Limiting alcohol use. What can I expect for my preventive care visit? Physical exam Your health care provider may check your:  Height and weight. These may be used to calculate your BMI (body mass index). BMI is a measurement that tells if you are at a healthy weight.  Heart rate and blood pressure.  Body temperature.  Skin for abnormal spots. Counseling Your health care provider may ask you questions about your:  Past medical problems.  Family's medical history.  Alcohol, tobacco, and drug use.  Emotional well-being.  Home life and relationship well-being.  Sexual activity.  Diet, exercise, and sleep habits.  Work and work Statistician.  Access to firearms.  Method of birth control.  Menstrual cycle.  Pregnancy history. What immunizations do I need? Vaccines are usually given at various ages, according to a schedule. Your health care provider will recommend vaccines for you based on your age, medical history, and lifestyle or other factors, such as travel or where you work.   What tests do I need? Blood tests  Lipid and cholesterol levels. These may be checked every 5 years starting at age 51.  Hepatitis C test.  Hepatitis B test. Screening  Diabetes  screening. This is done by checking your blood sugar (glucose) after you have not eaten for a while (fasting).  STD (sexually transmitted disease) testing, if you are at risk.  BRCA-related cancer screening. This may be done if you have a family history of breast, ovarian, tubal, or peritoneal cancers.  Pelvic exam and Pap test. This may be done every 3 years starting at age 55. Starting at age 13, this may be done every 5 years if you have a Pap test in combination with an HPV test. Talk with your health care provider about your test results, treatment options, and if necessary, the need for more tests.   Follow these instructions at home: Eating and drinking  Eat a healthy diet that includes fresh fruits and vegetables, whole grains, lean protein, and low-fat dairy products.  Take vitamin and mineral supplements as recommended by your health care provider.  Do not drink alcohol if: ? Your health care provider tells you not to drink. ? You are pregnant, may be pregnant, or are planning to become pregnant.  If you drink alcohol: ? Limit how much you have to 0-1 drink a day. ? Be aware of how much alcohol is in your drink. In the [place] one drink equals one 12 oz bottle of beer (355 mL), one 5 oz glass of wine (148 mL), or one 1 oz glass of hard liquor (44 mL).   Lifestyle  Take daily care of your teeth and gums. Brush your teeth every morning and night with fluoride toothpaste. Floss one time each day.  Stay active. Exercise for at least  30 minutes 5 or more days each week.  Do not use any products that contain nicotine or tobacco, such as cigarettes, e-cigarettes, and chewing tobacco. If you need help quitting, ask your health care provider.  Do not use drugs.  If you are sexually active, practice safe sex. Use a condom or other form of protection to prevent STIs (sexually transmitted infections).  If you do not wish to become pregnant, use a form of birth control. If you plan to  become pregnant, see your health care provider for a prepregnancy visit.  Find healthy ways to cope with stress, such as: ? Meditation, yoga, or listening to music. ? Journaling. ? Talking to a trusted person. ? Spending time with friends and family. Safety  Always wear your seat belt while driving or riding in a vehicle.  Do not drive: ? If you have been drinking alcohol. Do not ride with someone who has been drinking. ? When you are tired or distracted. ? While texting.  Wear a helmet and other protective equipment during sports activities.  If you have firearms in your house, make sure you follow all gun safety procedures.  Seek help if you have been physically or sexually abused. What's next?  Go to your health care provider once a year for an annual wellness visit.  Ask your health care provider how often you should have your eyes and teeth checked.  Stay up to date on all vaccines. This information is not intended to replace advice given to you by your health care provider. Make sure you discuss any questions you have with your health care provider. Document Revised: 09/15/2019 Document Reviewed: 09/28/2017 Elsevier Patient Education  2021 [employer].

## 2020-04-25 LAB — COMPREHENSIVE METABOLIC PANEL
ALT: 11 IU/L (ref 0–32)
AST: 16 IU/L (ref 0–40)
Albumin/Globulin Ratio: 1.5 (ref 1.2–2.2)
Albumin: 4.3 g/dL (ref 3.8–4.8)
Alkaline Phosphatase: 52 IU/L (ref 44–121)
BUN/Creatinine Ratio: 11 (ref 9–23)
BUN: 8 mg/dL (ref 6–20)
Bilirubin Total: 0.5 mg/dL (ref 0.0–1.2)
CO2: 21 mmol/L (ref 20–29)
Calcium: 9.3 mg/dL (ref 8.7–10.2)
Chloride: 104 mmol/L (ref 96–106)
Creatinine, Ser: 0.74 mg/dL (ref 0.57–1.00)
Globulin, Total: 2.8 g/dL (ref 1.5–4.5)
Glucose: 79 mg/dL (ref 65–99)
Potassium: 4.2 mmol/L (ref 3.5–5.2)
Sodium: 140 mmol/L (ref 134–144)
Total Protein: 7.1 g/dL (ref 6.0–8.5)
eGFR: 111 mL/min/{1.73_m2} (ref >59)

## 2020-04-25 LAB — TSH: TSH: 1.03 u[IU]/mL (ref 0.450–4.500)

## 2020-04-25 LAB — CBC WITH DIFFERENTIAL/PLATELET
Basophils Absolute: 0.1 10*3/uL (ref 0.0–0.2)
Basos: 1 %
EOS (ABSOLUTE): 0 10*3/uL (ref 0.0–0.4)
Eos: 1 %
Hematocrit: 38.6 % (ref 34.0–46.6)
Hemoglobin: 12.6 g/dL (ref 11.1–15.9)
Immature Grans (Abs): 0 10*3/uL (ref 0.0–0.1)
Immature Granulocytes: 0 %
Lymphocytes Absolute: 2.3 10*3/uL (ref 0.7–3.1)
Lymphs: 29 %
MCH: 28.8 pg (ref 26.6–33.0)
MCHC: 32.6 g/dL (ref 31.5–35.7)
MCV: 88 fL (ref 79–97)
Monocytes Absolute: 0.4 10*3/uL (ref 0.1–0.9)
Monocytes: 5 %
Neutrophils Absolute: 5.3 10*3/uL (ref 1.4–7.0)
Neutrophils: 64 %
Platelets: 197 10*3/uL (ref 150–450)
RBC: 4.38 x10E6/uL (ref 3.77–5.28)
RDW: 12.8 % (ref 11.7–15.4)
WBC: 8.1 10*3/uL (ref 3.4–10.8)

## 2020-04-25 LAB — LIPID PANEL
Chol/HDL Ratio: 3.4 ratio (ref 0.0–4.4)
Cholesterol, Total: 151 mg/dL (ref 100–199)
HDL: 45 mg/dL (ref >39)
LDL Chol Calc (NIH): 95 mg/dL (ref 0–99)
Triglycerides: 52 mg/dL (ref 0–149)
VLDL Cholesterol Cal: 11 mg/dL (ref 5–40)

## 2020-04-25 LAB — HEPATITIS C ANTIBODY: Hep C Virus Ab: <0.1 s/co ratio (ref 0.0–0.9)

## 2020-04-27 ENCOUNTER — Ambulatory Visit: Payer: Managed Care, Other (non HMO) | Admitting: Family Medicine

## 2021-02-04 ENCOUNTER — Encounter: Payer: Self-pay | Admitting: Obstetrics and Gynecology

## 2021-02-04 ENCOUNTER — Ambulatory Visit (INDEPENDENT_AMBULATORY_CARE_PROVIDER_SITE_OTHER): Payer: Managed Care, Other (non HMO) | Admitting: Obstetrics and Gynecology

## 2021-02-04 ENCOUNTER — Other Ambulatory Visit: Payer: Self-pay

## 2021-02-04 VITALS — BP 106/65 | Ht 67.0 in | Wt 129.6 lb

## 2021-02-04 DIAGNOSIS — N921 Excessive and frequent menstruation with irregular cycle: Secondary | ICD-10-CM | POA: Diagnosis not present

## 2021-02-04 DIAGNOSIS — Z975 Presence of (intrauterine) contraceptive device: Secondary | ICD-10-CM | POA: Diagnosis not present

## 2021-02-04 MED ORDER — ESTRADIOL 1 MG PO TABS: 1.0000 mg | ORAL_TABLET | Freq: Every day | ORAL | 1 refills | Status: DC

## 2021-02-04 NOTE — Progress Notes (Signed)
° ° °  GYNECOLOGY PROGRESS NOTE  Subjective:    Patient ID: Tammie Burke, female    DOB: 03-Feb-1990, 31 y.o.   MRN: [id number]  HPI  Patient is a 31 y.o. G47P1102 female who presents for complaints of breakthrough bleeding on Nexplanon x 3 months. Bleeds every week for 4 days, then off 3. Has had the Nexplanon in place for ~ 2 years. Would like to discuss removal and to initiate OCPs.   The following portions of the patient's history were reviewed and updated as appropriate: allergies, current medications, past family history, past medical history, past social history, past surgical history, and problem list.  Review of Systems Pertinent items noted in HPI and remainder of comprehensive ROS otherwise negative.   Objective:   Blood pressure 106/65, height 5\' 7"  (1.702 m), weight 129 lb 9.6 oz (58.8 kg). Body mass index is 20.3 kg/m. General appearance: alert and no distress Remainder of exam deferred.    Assessment:   1. Breakthrough bleeding on Nexplanon     Plan:   Discussed option of Nexplanon removal vs trial of estrogen supplementation with or without NSAIDs to temporize the bleeding. Patient willing to try.  Will prescribe Estradiol.  On further discussion of other options for contraception, patient admits that she is not as good with remembering to take pills daily. Discussed all other forms of contraception in case Nexplanon removal is warranted in the near future.   Call or return to clinic prn if these symptoms worsen or fail to improve as anticipated.   Rubie Maid, MD [facility]

## 2021-02-04 NOTE — Patient Instructions (Signed)

## 2021-04-23 NOTE — Progress Notes (Signed)
? ? ?I,Sulibeya S Dimas,acting as a scribe for Lavon Paganini, MD.,have documented all relevant documentation on the behalf of Lavon Paganini, MD,as directed by  Lavon Paganini, MD while in the presence of Lavon Paganini, MD. ? ? ?Complete physical exam ? ? ?Patient: Tammie Burke   DOB: March 30, 1990   31 y.o. Female  MRN: [id number] ?Visit Date: 04/26/2021 ? ?Today's healthcare provider: Lavon Paganini, MD  ? ?Chief Complaint  ?Patient presents with  ? Annual Exam  ? ?Subjective  ?  ?Tammie Burke is a 31 y.o. female who presents today for a complete physical exam.  ?She reports consuming a general diet. The patient does not participate in regular exercise at present. She generally feels well. She reports sleeping well. She does not have additional problems to discuss today.  ?HPI  ?Pap- followed by GYN ? ?Weight is down from 138 to 124 in the last year. Feels like appetite is low after being in school. ? ?Past Medical History:  ?Diagnosis Date  ? Anemia   ? History of placenta abruption 06/01/2018  ? History of premature delivery 06/01/2018  ? History of VBAC 05/31/2018  ? Previous cesarean section 04/05/2018  ? Tuberculosis 07/2016  ? ?Past Surgical History:  ?Procedure Laterality Date  ? CESAREAN SECTION N/A 02/19/2016  ? Procedure: CESAREAN SECTION;  Surgeon: Will Bonnet, MD;  Location: [facility] ORS;  Service: Obstetrics;  Laterality: N/A;  ? NO PAST SURGERIES    ? ?Social History  ? ?Socioeconomic History  ? Marital status: Married  ?  Spouse name: Sloane Palmer  ? Number of children: 2  ? Years of education: Not on file  ? Highest education level: Some college, no degree  ?Occupational History  ? Occupation: CNA  ?Tobacco Use  ? Smoking status: Never  ? Smokeless tobacco: Never  ?Vaping Use  ? Vaping Use: Never used  ?Substance and Sexual Activity  ? Alcohol use: No  ? Drug use: No  ? Sexual activity: Yes  ?  Birth control/protection: Inserts  ?  Comment: Nexplanon  ?Other Topics  Concern  ? Not on file  ?Social History Narrative  ? Not on file  ? ?Social Determinants of Health  ? ?Financial Resource Strain: Not on file  ?Food Insecurity: Not on file  ?Transportation Needs: Not on file  ?Physical Activity: Not on file  ?Stress: Not on file  ?Social Connections: Not on file  ?Intimate Partner Violence: Not on file  ? ?Family Status  ?Relation Name Status  ? Mother  Alive  ? Father  Deceased  ?     accidental death  ? Sister x 5 Alive  ? Brother x 3 Alive  ? Daughter  Alive  ? Son  Alive  ? Neg Hx  (Not Specified)  ? ?Family History  ?Problem Relation Age of Onset  ? Healthy Mother   ? Healthy Sister   ? Healthy Brother   ? Cancer Neg Hx   ? Diabetes Neg Hx   ? Hypertension Neg Hx   ? Thyroid disease Neg Hx   ? ?No Known Allergies  ?Patient Care Team: ?Virginia Crews, MD as PCP - General (Family Medicine)  ? ?Medications: ?Outpatient Medications Prior to Visit  ?Medication Sig  ? estradiol (ESTRACE) 1 MG tablet Take 1 tablet (1 mg total) by mouth daily for 14 days.  ? ?No facility-administered medications prior to visit.  ? ? ?Review of Systems  ?Constitutional:  Positive for appetite change and unexpected weight  change.  ?All other systems reviewed and are negative. ? ?Last CBC ?Lab Results  ?Component Value Date  ? WBC 8.1 04/24/2020  ? HGB 12.6 04/24/2020  ? HCT 38.6 04/24/2020  ? MCV 88 04/24/2020  ? MCH 28.8 04/24/2020  ? RDW 12.8 04/24/2020  ? PLT 197 04/24/2020  ? ?Last metabolic panel ?Lab Results  ?Component Value Date  ? GLUCOSE 79 04/24/2020  ? NA 140 04/24/2020  ? K 4.2 04/24/2020  ? CL 104 04/24/2020  ? CO2 21 04/24/2020  ? BUN 8 04/24/2020  ? CREATININE 0.74 04/24/2020  ? EGFR 111 04/24/2020  ? CALCIUM 9.3 04/24/2020  ? PROT 7.1 04/24/2020  ? ALBUMIN 4.3 04/24/2020  ? LABGLOB 2.8 04/24/2020  ? AGRATIO 1.5 04/24/2020  ? BILITOT 0.5 04/24/2020  ? ALKPHOS 52 04/24/2020  ? AST 16 04/24/2020  ? ALT 11 04/24/2020  ? ?Last lipids ?Lab Results  ?Component Value Date  ? CHOL 151  04/24/2020  ? HDL 45 04/24/2020  ? [place] 95 04/24/2020  ? TRIG 52 04/24/2020  ? CHOLHDL 3.4 04/24/2020  ? ?Last hemoglobin A1c ?No results found for: HGBA1C ?Last thyroid functions ?Lab Results  ?Component Value Date  ? TSH 1.030 04/24/2020  ? ?  ? Objective  ?  ?BP 98/67 (BP Location: Left Arm, Patient Position: Sitting, Cuff Size: Normal)   Pulse 83   Temp 97.6 ?F (36.4 ?C) (Temporal)   Resp 16   Ht 5' 8"  (1.727 m)   Wt 124 lb 9.6 oz (56.5 kg)   BMI 18.95 kg/m?  ?BP Readings from Last 3 Encounters:  ?04/26/21 98/67  ?02/04/21 106/65  ?04/24/20 106/71  ? ?Wt Readings from Last 3 Encounters:  ?04/26/21 124 lb 9.6 oz (56.5 kg)  ?02/04/21 129 lb 9.6 oz (58.8 kg)  ?04/24/20 138 lb 4.8 oz (62.7 kg)  ? ?  ? ? ?Physical Exam ?Vitals reviewed.  ?Constitutional:   ?   General: She is not in acute distress. ?   Appearance: Normal appearance. She is well-developed. She is not diaphoretic.  ?HENT:  ?   Head: Normocephalic and atraumatic.  ?   Right Ear: Tympanic membrane, ear canal and external ear normal.  ?   Left Ear: Tympanic membrane, ear canal and external ear normal.  ?   Nose: Nose normal.  ?   Mouth/Throat:  ?   Mouth: Mucous membranes are moist.  ?   Pharynx: Oropharynx is clear. No oropharyngeal exudate.  ?Eyes:  ?   General: No scleral icterus. ?   Conjunctiva/sclera: Conjunctivae normal.  ?   Pupils: Pupils are equal, round, and reactive to light.  ?Neck:  ?   Thyroid: No thyromegaly.  ?Cardiovascular:  ?   Rate and Rhythm: Normal rate and regular rhythm.  ?   Pulses: Normal pulses.  ?   Heart sounds: Normal heart sounds. No murmur heard. ?Pulmonary:  ?   Effort: Pulmonary effort is normal. No respiratory distress.  ?   Breath sounds: Normal breath sounds. No wheezing or rales.  ?Abdominal:  ?   General: There is no distension.  ?   Palpations: Abdomen is soft.  ?   Tenderness: There is no abdominal tenderness.  ?Musculoskeletal:     ?   General: No deformity.  ?   Cervical back: Neck supple.  ?   Right  lower leg: No edema.  ?   Left lower leg: No edema.  ?Lymphadenopathy:  ?   Cervical: No cervical adenopathy.  ?Skin: ?  General: Skin is warm and dry.  ?   Findings: No rash.  ?Neurological:  ?   Mental Status: She is alert and oriented to person, place, and time. Mental status is at baseline.  ?   Gait: Gait normal.  ?Psychiatric:     ?   Mood and Affect: Mood normal.     ?   Behavior: Behavior normal.     ?   Thought Content: Thought content normal.  ?  ? ? ?Last depression screening scores ? ?  04/26/2021  ?  3:33 PM 04/24/2020  ?  9:37 AM 12/06/2018  ?  2:56 PM  ?PHQ 2/9 Scores  ?PHQ - 2 Score 0 0 0  ?PHQ- 9 Score 1 0 0  ? ?Last fall risk screening ? ?  04/26/2021  ?  3:33 PM  ?Fall Risk   ?Falls in the past year? 0  ?Number falls in past yr: 0  ?Injury with Fall? 0  ?Risk for fall due to : No Fall Risks  ?Follow up Falls evaluation completed  ? ?Last Audit-C alcohol use screening ? ?  04/26/2021  ?  3:33 PM  ?Alcohol Use Disorder Test (AUDIT)  ?1. How often do you have a drink containing alcohol? 1  ?2. How many drinks containing alcohol do you have on a typical day when you are drinking? 0  ?3. How often do you have six or more drinks on one occasion? 0  ?AUDIT-C Score 1  ? ?A score of 3 or more in women, and 4 or more in men indicates increased risk for alcohol abuse, EXCEPT if all of the points are from question 1  ? ?No results found for any visits on 04/26/21. ? Assessment & Plan  ?  ?Routine Health Maintenance and Physical Exam ? ?Exercise Activities and Dietary recommendations ? Goals   ?None ?  ? ? ?Immunization History  ?Administered Date(s) Administered  ? Influenza Split 02/13/2017  ? Influenza,inj,Quad PF,6+ Mos 10/29/2018  ? Influenza-Unspecified 11/21/2019, 11/10/2020  ? MMR 12/04/2014  ? Td 12/04/2014  ? Tdap 08/28/2018  ? ? ?Health Maintenance  ?Topic Date Due  ? PAP SMEAR-Modifier  11/21/2024  ? TETANUS/TDAP  08/27/2028  ? INFLUENZA VACCINE  Completed  ? Hepatitis C Screening  Completed  ? HIV  Screening  Completed  ? HPV VACCINES  Aged Out  ? ? ?Discussed health benefits of physical activity, and encouraged her to engage in regular exercise appropriate for her age and condition. ? ?Problem List Items Address

## 2021-04-26 ENCOUNTER — Encounter: Payer: Self-pay | Admitting: Family Medicine

## 2021-04-26 ENCOUNTER — Ambulatory Visit (INDEPENDENT_AMBULATORY_CARE_PROVIDER_SITE_OTHER): Payer: Managed Care, Other (non HMO) | Admitting: Family Medicine

## 2021-04-26 ENCOUNTER — Other Ambulatory Visit: Payer: Self-pay

## 2021-04-26 VITALS — BP 98/67 | HR 83 | Temp 97.6°F | Resp 16 | Ht 68.0 in | Wt 124.6 lb

## 2021-04-26 DIAGNOSIS — R634 Abnormal weight loss: Secondary | ICD-10-CM | POA: Diagnosis not present

## 2021-04-26 DIAGNOSIS — Z0000 Encounter for general adult medical examination without abnormal findings: Secondary | ICD-10-CM

## 2021-04-26 NOTE — Assessment & Plan Note (Signed)
Loss of 14 lbs in the last year ?Asymptomatic ?She reports decreased appetite since going back to school and being busy ?She will incorporate more protein and calories into her diet throughout the day ?Check labs for any underlying causes ?She will monitor home weights and f/u if it continues to drop ?

## 2021-04-28 LAB — CBC WITH DIFFERENTIAL/PLATELET
Basophils Absolute: 0.1 10*3/uL (ref 0.0–0.2)
Basos: 1 %
EOS (ABSOLUTE): 0.2 10*3/uL (ref 0.0–0.4)
Eos: 2 %
Hematocrit: 37 % (ref 34.0–46.6)
Hemoglobin: 12.5 g/dL (ref 11.1–15.9)
Immature Grans (Abs): 0 10*3/uL (ref 0.0–0.1)
Immature Granulocytes: 0 %
Lymphocytes Absolute: 3.1 10*3/uL (ref 0.7–3.1)
Lymphs: 37 %
MCH: 28.9 pg (ref 26.6–33.0)
MCHC: 33.8 g/dL (ref 31.5–35.7)
MCV: 86 fL (ref 79–97)
Monocytes Absolute: 0.5 10*3/uL (ref 0.1–0.9)
Monocytes: 6 %
Neutrophils Absolute: 4.4 10*3/uL (ref 1.4–7.0)
Neutrophils: 54 %
Platelets: 216 10*3/uL (ref 150–450)
RBC: 4.32 x10E6/uL (ref 3.77–5.28)
RDW: 13.9 % (ref 11.7–15.4)
WBC: 8.3 10*3/uL (ref 3.4–10.8)

## 2021-04-28 LAB — COMPREHENSIVE METABOLIC PANEL
ALT: 9 IU/L (ref 0–32)
AST: 14 IU/L (ref 0–40)
Albumin/Globulin Ratio: 1.3 (ref 1.2–2.2)
Albumin: 4.3 g/dL (ref 3.8–4.8)
Alkaline Phosphatase: 53 IU/L (ref 44–121)
BUN/Creatinine Ratio: 19 (ref 9–23)
BUN: 15 mg/dL (ref 6–20)
Bilirubin Total: 0.4 mg/dL (ref 0.0–1.2)
CO2: 22 mmol/L (ref 20–29)
Calcium: 9.4 mg/dL (ref 8.7–10.2)
Chloride: 102 mmol/L (ref 96–106)
Creatinine, Ser: 0.78 mg/dL (ref 0.57–1.00)
Globulin, Total: 3.2 g/dL (ref 1.5–4.5)
Glucose: 86 mg/dL (ref 70–99)
Potassium: 4.1 mmol/L (ref 3.5–5.2)
Sodium: 137 mmol/L (ref 134–144)
Total Protein: 7.5 g/dL (ref 6.0–8.5)
eGFR: 103 mL/min/{1.73_m2} (ref >59)

## 2021-04-28 LAB — LIPID PANEL
Chol/HDL Ratio: 3.3 ratio (ref 0.0–4.4)
Cholesterol, Total: 160 mg/dL (ref 100–199)
HDL: 49 mg/dL (ref >39)
LDL Chol Calc (NIH): 101 mg/dL — ABNORMAL HIGH (ref 0–99)
Triglycerides: 47 mg/dL (ref 0–149)
VLDL Cholesterol Cal: 10 mg/dL (ref 5–40)

## 2021-04-28 LAB — TSH: TSH: 1.44 u[IU]/mL (ref 0.450–4.500)

## 2021-05-05 ENCOUNTER — Ambulatory Visit (INDEPENDENT_AMBULATORY_CARE_PROVIDER_SITE_OTHER): Payer: Managed Care, Other (non HMO) | Admitting: Obstetrics and Gynecology

## 2021-05-05 ENCOUNTER — Encounter: Payer: Self-pay | Admitting: Obstetrics and Gynecology

## 2021-05-05 VITALS — BP 104/66 | HR 73 | Resp 16 | Ht 67.0 in | Wt 124.8 lb

## 2021-05-05 DIAGNOSIS — Z3046 Encounter for surveillance of implantable subdermal contraceptive: Secondary | ICD-10-CM

## 2021-05-05 DIAGNOSIS — N921 Excessive and frequent menstruation with irregular cycle: Secondary | ICD-10-CM

## 2021-05-05 NOTE — Patient Instructions (Signed)
NEXPLANON PLACEMENT POST-PROCEDURE INSTRUCTIONS ? ?You may take Ibuprofen, Aleve or Tylenol for pain if needed.  Pain should resolve within in 24 hours. ? ? ?Shower or bathe as normal.  You can remove the bandage after 24 hours. ? ?

## 2021-05-05 NOTE — Progress Notes (Signed)
? ? ? ? ?  GYNECOLOGY OFFICE PROCEDURE NOTE ? ?Cheila Yalitza Teed is a 31 y.o. [id number] here for Nexplanon removal for abnormal breakthrough bleeding.  Last pap smear was on 11/22/2019 and was normal.  No other gynecologic concerns. ? ? ?Nexplanon Removal ?Patient identified, informed consent performed, consent signed.   Appropriate time out taken. Nexplanon site identified.  Area prepped in usual sterile fashon. One ml of 1% lidocaine was used to anesthetize the area at the distal end of the implant. A small stab incision was made right beside the implant on the distal portion.  The Nexplanon rod was grasped using hemostats and removed without difficulty.  There was minimal blood loss. There were no complications.  3 ml of 1% lidocaine was injected around the incision for post-procedure analgesia.  Steri-strips were applied over the small incision.  A pressure bandage was applied to reduce any bruising.  The patient tolerated the procedure well and was given post procedure instructions.  Patient is planning to use abstinence for contraception at this time.  ? ? ?Hildred Laser, MD ?[facility] ? ? ? ?

## 2021-07-19 ENCOUNTER — Telehealth: Payer: Self-pay

## 2021-07-19 DIAGNOSIS — Z789 Other specified health status: Secondary | ICD-10-CM

## 2021-07-19 DIAGNOSIS — Z111 Encounter for screening for respiratory tuberculosis: Secondary | ICD-10-CM

## 2021-07-19 NOTE — Telephone Encounter (Signed)
Lmtcb to let us know what labs she need for school.

## 2021-07-19 NOTE — Telephone Encounter (Signed)
Copied from CRM [id number]. Topic: General - Other >> Jul 19, 2021  3:21 PM Ja-Kwan M wrote: Reason for CRM: Pt stated she needs to have labs drawn for school. Pt requests call back once lab order has been placed. Cb# [phone]

## 2021-07-20 NOTE — Telephone Encounter (Signed)
Patient needs a Quantiferon TB Gold and Quantitative lab (igG titer) for Hepatitis B, MMR and Varicella. Can you double check for me if orders are correct thanks.

## 2021-07-22 NOTE — Telephone Encounter (Signed)
Patient advised.

## 2021-07-26 LAB — QUANTIFERON-TB GOLD PLUS
QuantiFERON Mitogen Value: >10 IU/mL
QuantiFERON Nil Value: >10 IU/mL
QuantiFERON TB1 Ag Value: >10 IU/mL
QuantiFERON TB2 Ag Value: >10 IU/mL
QuantiFERON-TB Gold Plus: UNDETERMINED — AB

## 2021-07-26 LAB — MEASLES/MUMPS/RUBELLA IMMUNITY
MUMPS ABS, IGG: 117 AU/mL (ref >10.9)
RUBEOLA AB, IGG: 70.5 AU/mL (ref >16.4)
Rubella Antibodies, IGG: 15.6 index (ref >0.99)

## 2021-07-26 LAB — VARICELLA ZOSTER ABS, IGG/IGM
Varicella IgM: <0.91 index (ref 0.00–0.90)
Varicella zoster IgG: 1070 index (ref >165)

## 2021-07-26 LAB — HEPATITIS B SURFACE ANTIBODY,QUALITATIVE: Hep B Surface Ab, Qual: REACTIVE

## 2021-07-27 ENCOUNTER — Other Ambulatory Visit: Payer: Self-pay

## 2021-07-27 DIAGNOSIS — Z111 Encounter for screening for respiratory tuberculosis: Secondary | ICD-10-CM

## 2021-08-04 ENCOUNTER — Telehealth: Payer: Self-pay | Admitting: Family Medicine

## 2021-08-04 DIAGNOSIS — Z111 Encounter for screening for respiratory tuberculosis: Secondary | ICD-10-CM

## 2021-08-04 NOTE — Telephone Encounter (Signed)
This is fine. CXR ordered for [facility].

## 2021-08-04 NOTE — Telephone Encounter (Signed)
Not sure where is the paper work. Dr. Linwood Dibbles can she get a chest xray?

## 2021-08-04 NOTE — Telephone Encounter (Signed)
Patient advised that order for chest xray has been placed and that she can go and have it done. Address was provided and asked patient if she can bring paper work to the office again to be fill out and to request if it can be given to Dr. Leonard Schwartz or Dr Linwood Dibbles nurse. Thanks.

## 2021-08-04 NOTE — Telephone Encounter (Addendum)
Pt came in to check on form that she dropped off on Thursday to have filled out.    Tammie Burke states that she put the paperwork in Dr. Senaida Burke box on Thursday.  Anyone know where this might be and the status of this???  Pt also states that she can not wait 4 weeks to have more blood work for the TB (last test was indeterminate) because it will be after the deadline for the paperwork for school.  She is wanting to know if she can have xray to determine.  She states that her deadline is quickly approaching and would like to find something out asap.  If it is past the deadline she has to wait another year for school.  Please advise.

## 2021-08-05 ENCOUNTER — Other Ambulatory Visit: Payer: Self-pay | Admitting: Family Medicine

## 2021-08-05 ENCOUNTER — Ambulatory Visit
Admission: RE | Admit: 2021-08-05 | Discharge: 2021-08-05 | Disposition: A | Discharge status: Home / Self Care | Payer: Managed Care, Other (non HMO) | Source: Ambulatory Visit | Attending: Family Medicine | Admitting: Family Medicine

## 2021-08-05 ENCOUNTER — Ambulatory Visit
Admission: RE | Admit: 2021-08-05 | Discharge: 2021-08-05 | Disposition: A | Discharge status: Home / Self Care | Payer: Managed Care, Other (non HMO) | Attending: Family Medicine | Admitting: Family Medicine

## 2021-08-05 DIAGNOSIS — Z111 Encounter for screening for respiratory tuberculosis: Secondary | ICD-10-CM

## 2021-08-05 NOTE — Telephone Encounter (Signed)
Pt brought paperwork.  Robynn Pane filled it out and was given to pt.

## 2021-12-28 ENCOUNTER — Encounter: Payer: Managed Care, Other (non HMO) | Admitting: Obstetrics and Gynecology

## 2022-04-28 ENCOUNTER — Encounter: Payer: Managed Care, Other (non HMO) | Admitting: Family Medicine

## 2022-06-10 ENCOUNTER — Encounter: Payer: Managed Care, Other (non HMO) | Admitting: Family Medicine

## 2022-06-21 ENCOUNTER — Ambulatory Visit (INDEPENDENT_AMBULATORY_CARE_PROVIDER_SITE_OTHER): Payer: Managed Care, Other (non HMO) | Admitting: Family Medicine

## 2022-06-21 ENCOUNTER — Encounter: Payer: Self-pay | Admitting: Family Medicine

## 2022-06-21 VITALS — BP 114/77 | HR 69 | Temp 97.8°F | Resp 12 | Ht 67.0 in | Wt 123.1 lb

## 2022-06-21 DIAGNOSIS — Z0000 Encounter for general adult medical examination without abnormal findings: Secondary | ICD-10-CM

## 2022-06-21 DIAGNOSIS — Z13220 Encounter for screening for lipoid disorders: Secondary | ICD-10-CM

## 2022-06-21 DIAGNOSIS — R1912 Hyperactive bowel sounds: Secondary | ICD-10-CM | POA: Insufficient documentation

## 2022-06-21 NOTE — Assessment & Plan Note (Signed)
No other abnormalities or symptoms Discussed in the absence of other symptoms this is not worrisome May find some foods that make it worse that she could avoid Can try gas x medication

## 2022-06-21 NOTE — Progress Notes (Signed)
I,Sulibeya S Dimas,acting as a Neurosurgeon for Shirlee Latch, MD.,have documented all relevant documentation on the behalf of Shirlee Latch, MD,as directed by  Shirlee Latch, MD while in the presence of Shirlee Latch, MD.    Complete physical exam   Patient: Tammie Burke   DOB: 04-29-1990   32 y.o. Female  MRN: [id number] Visit Date: 06/21/2022  Today's healthcare provider: Shirlee Latch, MD   Chief Complaint  Patient presents with   Annual Exam   Subjective    Tammie Burke is a 32 y.o. female who presents today for a complete physical exam.  She reports consuming a general diet. The patient does not participate in regular exercise at present. She generally feels fairly well. She reports sleeping well. She does have additional problems to discuss today.   HPI   Patient C/O stomach "making a lot of noise" especially after eating x one month. She denies any pain, constipation or diarrhea.   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   Past Surgical History:  Procedure Laterality Date   CESAREAN SECTION N/A 02/19/2016   Procedure: CESAREAN SECTION;  Surgeon: Conard Novak, MD;  Location: [facility] ORS;  Service: Obstetrics;  Laterality: N/A;   NO PAST SURGERIES     Social History   Socioeconomic History   Marital status: Married    Spouse name: Solicitor   Number of children: 2   Years of education: Not on file   Highest education level: Some college, no degree  Occupational History   Occupation: CNA  Tobacco Use   Smoking status: Never   Smokeless tobacco: Never  Vaping Use   Vaping Use: Never used  Substance and Sexual Activity   Alcohol use: No   Drug use: No   Sexual activity: Yes    Birth control/protection: Inserts    Comment: Nexplanon   Other Topics Concern   Not on file  Social History Narrative   Not on file   Social Determinants of Health   Financial Resource Strain: Not on file  Food Insecurity: Not on file  Transportation Needs: Not on file  Physical Activity: Not on file  Stress: Not on file  Social Connections: Not on file  Intimate Partner Violence: Not on file   Family Status  Relation Name Status   Mother  Alive   Father  Deceased       accidental death   Sister x 5 Alive   Brother x 3 Alive   Daughter  Alive   Son  Alive   Neg Hx  (Not Specified)   Family History  Problem Relation Age of Onset   Healthy Mother    Healthy Sister    Healthy Brother    Cancer Neg Hx    Diabetes Neg Hx    Hypertension Neg Hx    Thyroid disease Neg Hx    No Known Allergies  Patient Care Team: Milledge Gerding, Marzella Schlein, MD as PCP - General (Family Medicine)   Medications: No outpatient medications prior to visit.   No facility-administered medications prior to visit.    Review of Systems  All other systems reviewed and are negative.   Last CBC Lab Results  Component Value Date   WBC 8.3 04/27/2021   HGB 12.5 04/27/2021   HCT 37.0  04/27/2021   MCV 86 04/27/2021   MCH 28.9 04/27/2021   RDW 13.9 04/27/2021   PLT 216 04/27/2021   Last metabolic panel Lab Results  Component Value Date   GLUCOSE 86 04/27/2021   NA 137 04/27/2021   K 4.1 04/27/2021   CL 102 04/27/2021   CO2 22 04/27/2021   BUN 15 04/27/2021   CREATININE 0.78 04/27/2021   EGFR 103 04/27/2021   CALCIUM 9.4 04/27/2021   PROT 7.5 04/27/2021   ALBUMIN 4.3 04/27/2021   LABGLOB 3.2 04/27/2021   AGRATIO 1.3 04/27/2021   BILITOT 0.4 04/27/2021   ALKPHOS 53 04/27/2021   AST 14 04/27/2021   ALT 9 04/27/2021   Last lipids Lab Results  Component Value Date   CHOL 160 04/27/2021   HDL 49 04/27/2021   LDLCALC 101 (H) 04/27/2021   TRIG 47 04/27/2021   CHOLHDL 3.3 04/27/2021   Last thyroid functions Lab Results  Component Value  Date   TSH 1.440 04/27/2021      Objective    BP 114/77 (BP Location: Left Arm, Patient Position: Sitting, Cuff Size: Normal)   Pulse 69   Temp 97.8 F (36.6 C) (Temporal)   Resp 12   Ht 5\' 7"  (1.702 m)   Wt 123 lb 1.6 oz (55.8 kg)   BMI 19.28 kg/m  BP Readings from Last 3 Encounters:  06/21/22 114/77  05/05/21 104/66  04/26/21 98/67   Wt Readings from Last 3 Encounters:  06/21/22 123 lb 1.6 oz (55.8 kg)  05/05/21 124 lb 12.8 oz (56.6 kg)  04/26/21 124 lb 9.6 oz (56.5 kg)      Physical Exam Vitals reviewed.  Constitutional:      General: She is not in acute distress.    Appearance: Normal appearance. She is well-developed. She is not diaphoretic.  HENT:     Head: Normocephalic and atraumatic.     Right Ear: Tympanic membrane, ear canal and external ear normal.     Left Ear: Tympanic membrane, ear canal and external ear normal.     Nose: Nose normal.     Mouth/Throat:     Mouth: Mucous membranes are moist.     Pharynx: Oropharynx is clear. No oropharyngeal exudate.  Eyes:     General: No scleral icterus.    Conjunctiva/sclera: Conjunctivae normal.     Pupils: Pupils are equal, round, and reactive to light.  Neck:     Thyroid: No thyromegaly.  Cardiovascular:     Rate and Rhythm: Normal rate and regular rhythm.     Pulses: Normal pulses.     Heart sounds: Normal heart sounds. No murmur heard. Pulmonary:     Effort: Pulmonary effort is normal. No respiratory distress.     Breath sounds: Normal breath sounds. No wheezing or rales.  Abdominal:     General: Bowel sounds are increased. There is no distension.     Palpations: Abdomen is soft.     Tenderness: There is no abdominal tenderness.  Musculoskeletal:        General: No deformity.     Cervical back: Neck supple.     Right lower leg: No edema.     Left lower leg: No edema.  Lymphadenopathy:     Cervical: No cervical adenopathy.  Skin:    General: Skin is warm and dry.     Findings: No rash.   Neurological:     Mental Status: She is alert and oriented to person, place, and time. Mental status is  at baseline.     Gait: Gait normal.  Psychiatric:        Mood and Affect: Mood normal.        Behavior: Behavior normal.        Thought Content: Thought content normal.       Last depression screening scores    06/21/2022    1:26 PM 04/26/2021    3:33 PM 04/24/2020    9:37 AM  PHQ 2/9 Scores  PHQ - 2 Score 0 0 0  PHQ- 9 Score 0 1 0   Last fall risk screening    06/21/2022    1:25 PM  Fall Risk   Falls in the past year? 0  Number falls in past yr: 0  Injury with Fall? 0  Risk for fall due to : No Fall Risks  Follow up Falls evaluation completed   Last Audit-C alcohol use screening    06/21/2022    1:26 PM  Alcohol Use Disorder Test (AUDIT)  1. How often do you have a drink containing alcohol? 1  2. How many drinks containing alcohol do you have on a typical day when you are drinking? 0  3. How often do you have six or more drinks on one occasion? 0  AUDIT-C Score 1   A score of 3 or more in women, and 4 or more in men indicates increased risk for alcohol abuse, EXCEPT if all of the points are from question 1   No results found for any visits on 06/21/22.  Assessment & Plan    Routine Health Maintenance and Physical Exam  Exercise Activities and Dietary recommendations  Goals   None     Immunization History  Administered Date(s) Administered   Influenza Split 02/13/2017   Influenza,inj,Quad PF,6+ Mos 10/29/2018   Influenza-Unspecified 11/21/2019, 11/10/2020   MMR 12/04/2014   Moderna Covid-19 Vaccine Bivalent Booster 27yrs & up 11/13/2020   Moderna Sars-Covid-2 Vaccination 02/11/2019, 12/11/2019   Td 12/04/2014   Tdap 08/28/2018    Health Maintenance  Topic Date Due   COVID-19 Vaccine (4 - 2023-24 season) 10/01/2021   INFLUENZA VACCINE  09/01/2022   PAP SMEAR-Modifier  11/21/2024   DTaP/Tdap/Td (3 - Td or Tdap) 08/27/2028   Hepatitis C Screening   Completed   HIV Screening  Completed   HPV VACCINES  Aged Out    Discussed health benefits of physical activity, and encouraged her to engage in regular exercise appropriate for her age and condition.  Problem List Items Addressed This Visit       Other   Hyperactive bowel sounds    No other abnormalities or symptoms Discussed in the absence of other symptoms this is not worrisome May find some foods that make it worse that she could avoid Can try gas x medication      Other Visit Diagnoses     Encounter for annual physical exam    -  Primary   Relevant Orders   Lipid panel   Comprehensive metabolic panel   CBC   TSH   Screening for lipid disorders       Relevant Orders   Lipid panel        Return in about 1 year (around 06/21/2023) for CPE.     I, Shirlee Latch, MD, have reviewed all documentation for this visit. The documentation on 06/21/22 for the exam, diagnosis, procedures, and orders are all accurate and complete.   Caylor Cerino, Marzella Schlein, MD, MPH [facility]

## 2022-06-22 LAB — COMPREHENSIVE METABOLIC PANEL
ALT: 12 IU/L (ref 0–32)
AST: 13 IU/L (ref 0–40)
Albumin/Globulin Ratio: 1.4 (ref 1.2–2.2)
Albumin: 4.2 g/dL (ref 3.9–4.9)
Alkaline Phosphatase: 55 IU/L (ref 44–121)
BUN/Creatinine Ratio: 19 (ref 9–23)
BUN: 13 mg/dL (ref 6–20)
Bilirubin Total: 0.5 mg/dL (ref 0.0–1.2)
CO2: 20 mmol/L (ref 20–29)
Calcium: 9 mg/dL (ref 8.7–10.2)
Chloride: 103 mmol/L (ref 96–106)
Creatinine, Ser: 0.69 mg/dL (ref 0.57–1.00)
Globulin, Total: 3.1 g/dL (ref 1.5–4.5)
Glucose: 82 mg/dL (ref 70–99)
Potassium: 4.1 mmol/L (ref 3.5–5.2)
Sodium: 134 mmol/L (ref 134–144)
Total Protein: 7.3 g/dL (ref 6.0–8.5)
eGFR: 117 mL/min/{1.73_m2} (ref >59)

## 2022-06-22 LAB — TSH: TSH: 0.765 u[IU]/mL (ref 0.450–4.500)

## 2022-06-22 LAB — LIPID PANEL
Chol/HDL Ratio: 2.9 ratio (ref 0.0–4.4)
Cholesterol, Total: 153 mg/dL (ref 100–199)
HDL: 53 mg/dL (ref >39)
LDL Chol Calc (NIH): 89 mg/dL (ref 0–99)
Triglycerides: 52 mg/dL (ref 0–149)
VLDL Cholesterol Cal: 11 mg/dL (ref 5–40)

## 2022-06-22 LAB — CBC
Hematocrit: 37.7 % (ref 34.0–46.6)
Hemoglobin: 11.5 g/dL (ref 11.1–15.9)
MCH: 26.6 pg (ref 26.6–33.0)
MCHC: 30.5 g/dL — ABNORMAL LOW (ref 31.5–35.7)
MCV: 87 fL (ref 79–97)
Platelets: 222 10*3/uL (ref 150–450)
RBC: 4.32 x10E6/uL (ref 3.77–5.28)
RDW: 14 % (ref 11.7–15.4)
WBC: 8.3 10*3/uL (ref 3.4–10.8)

## 2022-07-11 ENCOUNTER — Telehealth: Payer: Self-pay

## 2022-07-11 DIAGNOSIS — Z111 Encounter for screening for respiratory tuberculosis: Secondary | ICD-10-CM

## 2022-07-11 NOTE — Telephone Encounter (Signed)
Copied from CRM [id number]. 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 She had one last year but they need a new one ordered for her. Please assist patient further

## 2022-07-11 NOTE — Telephone Encounter (Signed)
TB screening CXR ordered   Please advise patient to report to [facility] located at:  [address]  [place], [place] [address]  You do not need an appointment to have xrays completed.   Our office will follow up with  results once available.

## 2022-07-11 NOTE — Telephone Encounter (Signed)
Patient advised. Verbalized understanding and would like info to be sent via mychart

## 2022-07-19 ENCOUNTER — Ambulatory Visit
Admission: RE | Admit: 2022-07-19 | Discharge: 2022-07-19 | Disposition: A | Discharge status: Home / Self Care | Payer: Managed Care, Other (non HMO) | Source: Ambulatory Visit | Attending: Family Medicine

## 2022-07-19 ENCOUNTER — Ambulatory Visit
Admission: RE | Admit: 2022-07-19 | Discharge: 2022-07-19 | Disposition: A | Discharge status: Home / Self Care | Payer: Managed Care, Other (non HMO) | Attending: Family Medicine | Admitting: Family Medicine

## 2022-07-19 DIAGNOSIS — Z111 Encounter for screening for respiratory tuberculosis: Secondary | ICD-10-CM | POA: Insufficient documentation

## 2022-07-22 ENCOUNTER — Telehealth: Payer: Self-pay | Admitting: Family Medicine

## 2022-07-22 NOTE — Telephone Encounter (Signed)
Pt is calling to request a form for school for her results of her chest x-ray. Please advise CB- [phone]

## 2022-07-22 NOTE — Telephone Encounter (Signed)
Patient advised she can access it through [place] and she was agreeable to that.

## 2022-07-26 ENCOUNTER — Other Ambulatory Visit: Payer: Self-pay

## 2022-07-26 DIAGNOSIS — Z111 Encounter for screening for respiratory tuberculosis: Secondary | ICD-10-CM

## 2022-07-28 LAB — QUANTIFERON-TB GOLD PLUS

## 2022-08-02 LAB — QUANTIFERON-TB GOLD PLUS
QuantiFERON Nil Value: 5.6 IU/mL
QuantiFERON TB1 Ag Value: >10 IU/mL
QuantiFERON TB2 Ag Value: >10 IU/mL

## 2022-08-16 DIAGNOSIS — Z0279 Encounter for issue of other medical certificate: Secondary | ICD-10-CM

## 2023-06-22 ENCOUNTER — Ambulatory Visit (INDEPENDENT_AMBULATORY_CARE_PROVIDER_SITE_OTHER): Payer: Self-pay | Admitting: Family Medicine

## 2023-06-22 ENCOUNTER — Encounter: Payer: Self-pay | Admitting: Family Medicine

## 2023-06-22 VITALS — BP 115/78 | HR 73 | Ht 67.0 in | Wt 124.4 lb

## 2023-06-22 DIAGNOSIS — Z13220 Encounter for screening for lipoid disorders: Secondary | ICD-10-CM

## 2023-06-22 DIAGNOSIS — Z0000 Encounter for general adult medical examination without abnormal findings: Secondary | ICD-10-CM | POA: Insufficient documentation

## 2023-06-22 NOTE — Assessment & Plan Note (Signed)
 Patient has been doing well and has no concerns today. She has no chonic diseases and is not taking any medications. She has a history of latent TB in 2019 treated. Because she has had multiple negative chest x-rays and has completed her treatment for TB, we do not believe she needs to get yearly screening chest films. 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 We will obtain her routine screening labs and follow up with her next year. -Obtain CBC, CMP, and Lipids -Follow up in one year

## 2023-06-22 NOTE — Progress Notes (Signed)
 Complete physical exam  Patient: Tammie Burke   DOB: [birth year]   33 y.o. Female  MRN: [id number]  Subjective:     Chief Complaint  Patient presents with   Annual Exam    Last completed 06/21/22 Diet -  General, healthy Exercise - none Feeling - well Sleeping - well Concerns -  none    Tammie Burke is a 33 y.o. female who presents today for a complete physical exam. She reports consuming a general diet. She is finding it challenging to exercise due to her school schedule but is working on incorporating when she can. She generally feels well. She reports sleeping well. 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 She She does have a history of latent TB in 2019 but has been treated and has had serially negative CXRs over the past 6 years. She does not have additional problems to discuss today.    Most recent fall risk assessment:    06/22/2023    1:40 PM  Fall Risk   Falls in the past year? 0  Number falls in past yr: 0  Injury with Fall? 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   No facility-administered medications prior to visit.    Review of Systems   Constitutional: Negative.   HENT: Negative.    Eyes: Negative.   Respiratory: Negative.    Cardiovascular: Negative.   Gastrointestinal: Negative.   Genitourinary: Negative.   Musculoskeletal: Negative.   Skin: Negative.   Neurological: Negative.   Endo/Heme/Allergies: Negative.   Psychiatric/Behavioral: Negative.            Objective:     BP 115/78 (BP Location: Left Arm, Patient Position: Sitting, Cuff Size: Normal)   Pulse 73   Ht 5\' 7"  (1.702 m)   Wt 124 lb 6.4 oz (56.4 kg)   SpO2 100%   BMI 19.48 kg/m  BP Readings from Last 3 Encounters:  06/22/23 115/78  06/21/22 114/77  05/05/21 104/66   Wt Readings from Last 3 Encounters:  06/22/23 124 lb 6.4 oz (56.4 kg)  06/21/22 123 lb 1.6 oz (55.8 kg)  05/05/21 124 lb 12.8 oz (56.6 kg)      Physical Exam Constitutional:      General: She is not in acute distress.    Appearance: Normal appearance.  HENT:     Head: Normocephalic and atraumatic.     Right Ear: External ear normal.     Left Ear: External ear normal.     Nose:  Nose normal.     Mouth/Throat:     Mouth: Mucous membranes are moist.     Pharynx: Oropharynx is clear. No oropharyngeal exudate.  Eyes:     General: No scleral icterus.    Extraocular Movements: Extraocular movements intact.     Conjunctiva/sclera: Conjunctivae normal.     Pupils: Pupils are equal, round, and reactive to light.  Cardiovascular:     Rate and Rhythm: Normal rate and regular rhythm.     Pulses: Normal pulses.     Heart sounds: Normal heart sounds. No murmur heard.    No friction rub. No gallop.  Pulmonary:     Effort: Pulmonary effort is normal. No respiratory distress.     Breath sounds: Normal breath sounds. No wheezing.  Abdominal:     General: Abdomen is flat. Bowel sounds are normal. There is no distension.     Tenderness: There is no abdominal tenderness.  Musculoskeletal:        General: Normal range of motion.     Cervical back: Normal range of motion. No  rigidity or tenderness.  Skin:    General: Skin is warm and dry.  Neurological:     General: No focal deficit present.     Mental Status: She is alert and oriented to person, place, and time. Mental status is at baseline.     Cranial Nerves: No cranial nerve deficit.     Sensory: No sensory deficit.     Motor: No weakness.  Psychiatric:        Mood and Affect: Mood normal.        Behavior: Behavior normal.        Thought Content: Thought content normal.        Judgment: Judgment normal.      No results found for any visits on 06/22/23. Last CBC Lab Results  Component Value Date   WBC 8.3 06/21/2022   HGB 11.5 06/21/2022   HCT 37.7 06/21/2022   MCV 87 06/21/2022   MCH 26.6 06/21/2022   RDW 14.0 06/21/2022   PLT 222 06/21/2022   Last metabolic panel Lab Results  Component Value Date   GLUCOSE 82 06/21/2022   NA 134 06/21/2022   K 4.1 06/21/2022   CL 103 06/21/2022   CO2 20 06/21/2022   BUN 13 06/21/2022   CREATININE 0.69 06/21/2022   EGFR 117 06/21/2022   CALCIUM  9.0 06/21/2022   PROT 7.3 06/21/2022   ALBUMIN 4.2 06/21/2022   LABGLOB 3.1 06/21/2022   AGRATIO 1.4 06/21/2022   BILITOT 0.5 06/21/2022   ALKPHOS 55 06/21/2022   AST 13 06/21/2022   ALT 12 06/21/2022   Last lipids Lab Results  Component Value Date   CHOL 153 06/21/2022   HDL 53 06/21/2022   LDLCALC 89 06/21/2022   TRIG 52 06/21/2022   CHOLHDL 2.9 06/21/2022   Last hemoglobin A1c No results found for: "HGBA1C"      Assessment & Plan:    Routine Health Maintenance and Physical Exam  Immunization History  Administered Date(s) Administered   Influenza Split 02/13/2017   Influenza,inj,Quad PF,6+ Mos 10/29/2018   Influenza-Unspecified 11/21/2019, 11/10/2020   MMR 12/04/2014   Moderna Covid-19 Vaccine Bivalent Booster 83yrs & up 11/13/2020   Moderna Sars-Covid-2 Vaccination 02/11/2019, 12/11/2019   Td 12/04/2014   Tdap 08/28/2018    Health Maintenance  Topic Date Due   COVID-19 Vaccine  (4 - 2024-25 season) 10/02/2022   INFLUENZA VACCINE  09/01/2023   Cervical Cancer Screening (HPV/Pap  Cotest)  11/21/2024   DTaP/Tdap/Td (3 - Td or Tdap) 08/27/2028   Hepatitis C Screening  Completed   HIV Screening  Completed   HPV VACCINES  Aged Out   Meningococcal B Vaccine  Aged Out    Discussed health benefits of physical activity, and encouraged her to engage in regular exercise appropriate for her age and condition.  Problem List Items Addressed This Visit       Other   Encounter for annual physical exam - Primary   Patient has been doing well and has no concerns today. She has no chonic diseases and is not taking any medications. She has a history of latent TB in 2019 treated. Because she has had multiple negative chest x-rays and has completed her treatment for TB, we do not believe she needs to get yearly screening chest films. 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 We will obtain her routine screening labs and follow up with her next year. -Obtain CBC, CMP, and Lipids -Follow up in one year      Relevant Orders   CBC w/Diff/Platelet   Comprehensive metabolic panel with GFR   Lipid panel   Other Visit Diagnoses       Screening for lipid disorders       Relevant Orders   Lipid panel      Return in about 1 year (around 06/21/2024) for CPE.     Monda Angry, Medical Student    Patient seen along with MS3 student, Luther Saltness. I personally evaluated this patient along with the student, and verified all aspects of the history, physical exam, and medical decision making as documented by the student. I agree with the student's documentation and have made all necessary edits.  Assad Harbeson, Stan Eans, MD, MPH [facility]

## 2023-06-23 ENCOUNTER — Ambulatory Visit: Payer: Self-pay | Admitting: Family Medicine

## 2023-06-23 LAB — COMPREHENSIVE METABOLIC PANEL WITH GFR
ALT: 7 IU/L (ref 0–32)
AST: 15 IU/L (ref 0–40)
Albumin: 4.3 g/dL (ref 3.9–4.9)
Alkaline Phosphatase: 50 IU/L (ref 44–121)
BUN/Creatinine Ratio: 19 (ref 9–23)
BUN: 13 mg/dL (ref 6–20)
Bilirubin Total: 0.6 mg/dL (ref 0.0–1.2)
CO2: 19 mmol/L — ABNORMAL LOW (ref 20–29)
Calcium: 9.3 mg/dL (ref 8.7–10.2)
Chloride: 102 mmol/L (ref 96–106)
Creatinine, Ser: 0.67 mg/dL (ref 0.57–1.00)
Globulin, Total: 3 g/dL (ref 1.5–4.5)
Glucose: 78 mg/dL (ref 70–99)
Potassium: 4 mmol/L (ref 3.5–5.2)
Sodium: 134 mmol/L (ref 134–144)
Total Protein: 7.3 g/dL (ref 6.0–8.5)
eGFR: 117 mL/min/{1.73_m2} (ref >59)

## 2023-06-23 LAB — CBC WITH DIFFERENTIAL/PLATELET
Basophils Absolute: 0.1 10*3/uL (ref 0.0–0.2)
Basos: 0 %
EOS (ABSOLUTE): 0.1 10*3/uL (ref 0.0–0.4)
Eos: 1 %
Hematocrit: 35 % (ref 34.0–46.6)
Hemoglobin: 11.1 g/dL (ref 11.1–15.9)
Immature Grans (Abs): 0 10*3/uL (ref 0.0–0.1)
Immature Granulocytes: 0 %
Lymphocytes Absolute: 3.4 10*3/uL — ABNORMAL HIGH (ref 0.7–3.1)
Lymphs: 29 %
MCH: 26.8 pg (ref 26.6–33.0)
MCHC: 31.7 g/dL (ref 31.5–35.7)
MCV: 85 fL (ref 79–97)
Monocytes Absolute: 0.7 10*3/uL (ref 0.1–0.9)
Monocytes: 6 %
Neutrophils Absolute: 7.2 10*3/uL — ABNORMAL HIGH (ref 1.4–7.0)
Neutrophils: 64 %
Platelets: 198 10*3/uL (ref 150–450)
RBC: 4.14 x10E6/uL (ref 3.77–5.28)
RDW: 15.4 % (ref 11.7–15.4)
WBC: 11.4 10*3/uL — ABNORMAL HIGH (ref 3.4–10.8)

## 2023-06-23 LAB — LIPID PANEL
Chol/HDL Ratio: 2.9 ratio (ref 0.0–4.4)
Cholesterol, Total: 153 mg/dL (ref 100–199)
HDL: 53 mg/dL (ref >39)
LDL Chol Calc (NIH): 89 mg/dL (ref 0–99)
Triglycerides: 50 mg/dL (ref 0–149)
VLDL Cholesterol Cal: 11 mg/dL (ref 5–40)

## 2023-08-02 ENCOUNTER — Telehealth: Payer: Self-pay

## 2023-08-02 DIAGNOSIS — Z8611 Personal history of tuberculosis: Secondary | ICD-10-CM

## 2023-08-02 DIAGNOSIS — Z111 Encounter for screening for respiratory tuberculosis: Secondary | ICD-10-CM

## 2023-08-02 NOTE — Telephone Encounter (Signed)
 Patient was positive for TB in the past and said she needs screening CXR for school   Copied from CRM [id number]. Topic: Clinical - Request for Lab/Test Order >> Aug 02, 2023 11:17 AM Tammie Burke wrote: Reason for CRM: Pt wants to have a chest Xray for school.   Best contact: [phone]

## 2023-08-03 NOTE — Addendum Note (Signed)
 Addended by: LILIAN SEVERO RAMAN on: 08/03/2023 02:24 PM   Modules accepted: Orders

## 2023-08-03 NOTE — Telephone Encounter (Signed)
 Ok to order CXR 2v (dx: screening pulm tuberculosis, history of TB)

## 2023-08-07 ENCOUNTER — Ambulatory Visit
Admission: RE | Admit: 2023-08-07 | Discharge: 2023-08-07 | Disposition: A | Discharge status: Home / Self Care | Attending: Family Medicine | Admitting: Family Medicine

## 2023-08-07 ENCOUNTER — Ambulatory Visit
Admission: RE | Admit: 2023-08-07 | Discharge: 2023-08-07 | Disposition: A | Discharge status: Home / Self Care | Source: Ambulatory Visit | Attending: Family Medicine | Admitting: Family Medicine

## 2023-08-07 DIAGNOSIS — Z8611 Personal history of tuberculosis: Secondary | ICD-10-CM

## 2023-08-07 DIAGNOSIS — Z111 Encounter for screening for respiratory tuberculosis: Secondary | ICD-10-CM

## 2023-08-16 ENCOUNTER — Ambulatory Visit: Payer: Self-pay | Admitting: Family Medicine

## 2024-06-25 ENCOUNTER — Encounter: Admitting: Family Medicine

## 2024-06-27 ENCOUNTER — Encounter: Admitting: Family Medicine
